# Patient Record
Sex: Male | Born: 1970 | Race: Black or African American | Hispanic: No | Marital: Married | Smoking: Never smoker
Health system: Southern US, Community
[De-identification: ages and names within clinical notes are randomized; demographics above are authoritative.]

## PROBLEM LIST (undated history)

## (undated) DIAGNOSIS — J45909 Unspecified asthma, uncomplicated: Secondary | ICD-10-CM

## (undated) DIAGNOSIS — M1990 Unspecified osteoarthritis, unspecified site: Secondary | ICD-10-CM

## (undated) DIAGNOSIS — T7840XA Allergy, unspecified, initial encounter: Secondary | ICD-10-CM

## (undated) DIAGNOSIS — E785 Hyperlipidemia, unspecified: Secondary | ICD-10-CM

## (undated) DIAGNOSIS — K219 Gastro-esophageal reflux disease without esophagitis: Secondary | ICD-10-CM

## (undated) HISTORY — DX: Gastro-esophageal reflux disease without esophagitis: K21.9

## (undated) HISTORY — DX: Hyperlipidemia, unspecified: E78.5

## (undated) HISTORY — PX: MEDIAL COLLATERAL LIGAMENT AND LATERAL COLLATERAL LIGAMENT REPAIR, KNEE: SHX2017

## (undated) HISTORY — PX: KNEE SURGERY: SHX244

## (undated) HISTORY — DX: Allergy, unspecified, initial encounter: T78.40XA

---

## 2008-08-11 ENCOUNTER — Emergency Department (HOSPITAL_BASED_OUTPATIENT_CLINIC_OR_DEPARTMENT_OTHER): Admission: EM | Admit: 2008-08-11 | Discharge: 2008-08-11 | Payer: Self-pay | Admitting: Emergency Medicine

## 2008-08-15 ENCOUNTER — Ambulatory Visit: Payer: Self-pay | Admitting: *Deleted

## 2008-08-15 DIAGNOSIS — R319 Hematuria, unspecified: Secondary | ICD-10-CM | POA: Insufficient documentation

## 2008-08-15 DIAGNOSIS — K219 Gastro-esophageal reflux disease without esophagitis: Secondary | ICD-10-CM | POA: Insufficient documentation

## 2008-08-15 DIAGNOSIS — E785 Hyperlipidemia, unspecified: Secondary | ICD-10-CM | POA: Insufficient documentation

## 2008-08-15 DIAGNOSIS — J309 Allergic rhinitis, unspecified: Secondary | ICD-10-CM | POA: Insufficient documentation

## 2008-08-15 DIAGNOSIS — J45909 Unspecified asthma, uncomplicated: Secondary | ICD-10-CM | POA: Insufficient documentation

## 2008-08-15 DIAGNOSIS — F17200 Nicotine dependence, unspecified, uncomplicated: Secondary | ICD-10-CM | POA: Insufficient documentation

## 2008-08-15 DIAGNOSIS — F1210 Cannabis abuse, uncomplicated: Secondary | ICD-10-CM | POA: Insufficient documentation

## 2008-08-15 LAB — CONVERTED CEMR LAB
Bilirubin Urine: NEGATIVE
Glucose, Urine, Semiquant: NEGATIVE
Inflenza A Ag: NEGATIVE
Influenza B Ag: NEGATIVE
Ketones, urine, test strip: NEGATIVE
Nitrite: NEGATIVE
Specific Gravity, Urine: 1.025
Urobilinogen, UA: 0.2
WBC Urine, dipstick: NEGATIVE
pH: 5

## 2008-09-03 ENCOUNTER — Ambulatory Visit (HOSPITAL_COMMUNITY): Admission: RE | Admit: 2008-09-03 | Discharge: 2008-09-03 | Payer: Self-pay | Admitting: Urology

## 2010-01-23 ENCOUNTER — Ambulatory Visit: Payer: Self-pay | Admitting: Internal Medicine

## 2010-01-23 DIAGNOSIS — N401 Enlarged prostate with lower urinary tract symptoms: Secondary | ICD-10-CM | POA: Insufficient documentation

## 2010-01-23 LAB — CONVERTED CEMR LAB
ALT: 25 units/L (ref 0–53)
AST: 21 units/L (ref 0–37)
Albumin: 3.9 g/dL (ref 3.5–5.2)
Alkaline Phosphatase: 82 units/L (ref 39–117)
BUN: 8 mg/dL (ref 6–23)
Basophils Absolute: 0 10*3/uL (ref 0.0–0.1)
Basophils Relative: 0.6 % (ref 0.0–3.0)
Bilirubin Urine: NEGATIVE
Bilirubin, Direct: 0.1 mg/dL (ref 0.0–0.3)
CO2: 29 meq/L (ref 19–32)
Calcium: 9.5 mg/dL (ref 8.4–10.5)
Chloride: 106 meq/L (ref 96–112)
Cholesterol: 199 mg/dL (ref 0–200)
Creatinine, Ser: 1.2 mg/dL (ref 0.4–1.5)
Direct LDL: 141.8 mg/dL
Eosinophils Absolute: 0.1 10*3/uL (ref 0.0–0.7)
Eosinophils Relative: 1.1 % (ref 0.0–5.0)
GFR calc non Af Amer: 86.33 mL/min (ref >60)
Glucose, Bld: 88 mg/dL (ref 70–99)
HCT: 47.5 % (ref 39.0–52.0)
HDL: 40.5 mg/dL (ref >39.00)
Hemoglobin: 16 g/dL (ref 13.0–17.0)
Ketones, ur: NEGATIVE mg/dL
Leukocytes, UA: NEGATIVE
Lymphocytes Relative: 38.6 % (ref 12.0–46.0)
Lymphs Abs: 2.8 10*3/uL (ref 0.7–4.0)
MCHC: 33.8 g/dL (ref 30.0–36.0)
MCV: 93.6 fL (ref 78.0–100.0)
Monocytes Absolute: 0.6 10*3/uL (ref 0.1–1.0)
Monocytes Relative: 8.8 % (ref 3.0–12.0)
Neutro Abs: 3.7 10*3/uL (ref 1.4–7.7)
Neutrophils Relative %: 50.9 % (ref 43.0–77.0)
Nitrite: NEGATIVE
PSA: 1.37 ng/mL (ref 0.10–4.00)
Platelets: 228 10*3/uL (ref 150.0–400.0)
Potassium: 4 meq/L (ref 3.5–5.1)
RBC: 5.07 M/uL (ref 4.22–5.81)
RDW: 13.8 % (ref 11.5–14.6)
Sodium: 140 meq/L (ref 135–145)
Specific Gravity, Urine: 1.02 (ref 1.000–1.030)
TSH: 0.85 microintl units/mL (ref 0.35–5.50)
Total Bilirubin: 0.3 mg/dL (ref 0.3–1.2)
Total CHOL/HDL Ratio: 5
Total Protein: 7.2 g/dL (ref 6.0–8.3)
Triglycerides: 202 mg/dL — ABNORMAL HIGH (ref 0.0–149.0)
Urine Glucose: NEGATIVE mg/dL
Urobilinogen, UA: 0.2 (ref 0.0–1.0)
VLDL: 40.4 mg/dL — ABNORMAL HIGH (ref 0.0–40.0)
WBC: 7.3 10*3/uL (ref 4.5–10.5)
pH: 8 (ref 5.0–8.0)

## 2010-01-24 ENCOUNTER — Encounter: Payer: Self-pay | Admitting: Internal Medicine

## 2010-05-09 ENCOUNTER — Encounter: Payer: Self-pay | Admitting: Internal Medicine

## 2010-06-06 ENCOUNTER — Emergency Department (HOSPITAL_BASED_OUTPATIENT_CLINIC_OR_DEPARTMENT_OTHER): Admission: EM | Admit: 2010-06-06 | Discharge: 2010-06-06 | Payer: Self-pay | Admitting: Emergency Medicine

## 2010-06-06 ENCOUNTER — Ambulatory Visit: Payer: Self-pay | Admitting: Interventional Radiology

## 2010-08-04 ENCOUNTER — Encounter
Admission: RE | Admit: 2010-08-04 | Discharge: 2010-10-08 | Payer: Self-pay | Source: Home / Self Care | Attending: Orthopedic Surgery | Admitting: Orthopedic Surgery

## 2010-11-25 NOTE — Letter (Signed)
Summary: LEC Referral (unable to schedule) Notification  [facility]  [address] [place], [place] [address]   Phone: [phone]  Fax: [phone]      May 09, 2010 Matthew Franco 03/06/1971 MRN: [id number]   [facility] [address] [place], [place]  [address]   Dear Dr. Yetta Barre:   Thank you for your kind referral of the above patient. We have attempted to schedule the recommended COLONOSCOPY but have been unable to schedule because:  _X_ The patient was not available by phone and/or has not returned our calls.  __ The patient declined to schedule the procedure at this time.  We appreciate the referral and hope that we will have the opportunity to treat this patient in the future.    Sincerely,   [facility]  Vania Rea. Jarold Motto M.D. Hedwig Morton. Juanda Chance M.D. Venita Lick. Russella Dar M.D. Wilhemina Bonito. Marina Goodell M.D. Barbette Hair. Arlyce Dice M.D. Iva Boop M.D. Cheron Every.D.

## 2010-11-25 NOTE — Letter (Signed)
Summary: Lipid Letter  [facility]  [address] [place], [place] [address]   Phone: [phone]  Fax: [phone]    01/24/2010  Matthew Franco [address] [place], [place]  [address]  Dear Matthew Franco:  We have carefully reviewed your last lipid profile from  and the results are noted below with a summary of recommendations for lipid management.    Cholesterol:       199     Goal: <200   HDL "good" Cholesterol:   44.01     Goal: >40   LDL "bad" Cholesterol:   142     Goal: <130   Triglycerides:       202.0   !   Goal: <150    other labs look good    TLC Diet (Therapeutic Lifestyle Change): Saturated Fats & Transfatty acids should be kept < 7% of total calories ***Reduce Saturated Fats Polyunstaurated Fat can be up to 10% of total calories Monounsaturated Fat Fat can be up to 20% of total calories Total Fat should be no greater than 25-35% of total calories Carbohydrates should be 50-60% of total calories Protein should be approximately 15% of total calories Fiber should be at least 20-30 grams a day ***Increased fiber may help lower LDL Total Cholesterol should be < 200mg /day Consider adding plant stanol/sterols to diet (example: Benacol spread) ***A higher intake of unsaturated fat may reduce Triglycerides and Increase HDL    Adjunctive Measures (may lower LIPIDS and reduce risk of Heart Attack) include: Aerobic Exercise (20-30 minutes 3-4 times a week) Limit Alcohol Consumption Weight Reduction Aspirin 75-81 mg a day by mouth (if not allergic or contraindicated) Dietary Fiber 20-30 grams a day by mouth     Current Medications: 1)    Ventolin Hfa 108 (90 Base) Mcg/act Aers (Albuterol sulfate) .Marland Kitchen.. 1-2 puffs every 3-4 hours as needed shortness of breath.  (can substitute any brand of albuterol that is cheaper for patient) 2)    Dexilant 60 Mg Cpdr (Dexlansoprazole) .... One by mouth once daily for heartburn  If you have any questions, please call.  We appreciate being able to work with you.   Sincerely,    [facility] Etta Grandchild MD

## 2010-11-25 NOTE — Assessment & Plan Note (Signed)
Summary: NEW/ [employer] / NWS #   Vital Signs:  Patient profile:   40 year old male Height:      70 inches Weight:      223 pounds BMI:     32.11 O2 Sat:      97 % on Room air Temp:     98.0 degrees F oral Pulse rate:   69 / minute Pulse rhythm:   regular Resp:     16 per minute BP sitting:   120 / 68  (left arm) Cuff size:   large  Vitals Entered By: Rock Nephew CMA (January 23, 2010 1:24 PM)  Nutrition Counseling: Patient's BMI is greater than 25 and therefore counseled on weight management options.  O2 Flow:  Room air  Primary Care Provider:  Etta Grandchild MD   History of Present Illness: New to me to establish primary care.  Asthma History    Initial Asthma Severity Rating:    Age range: 12+ years    Symptoms: 0-2 days/week    Nighttime Awakenings: 0-2/month    Interferes w/ normal activity: no limitations    SABA use (not for EIB): 0-2 days/week    Asthma Severity Assessment: Intermittent  Dyspepsia History:      He has no alarm features of dyspepsia including no history of melena, hematochezia, dysphagia, persistent vomiting, or involuntary weight loss > 5%.  There is a prior history of GERD.  He notes that he has never had an upper endoscopy or GI consultation.  The patient does not have a prior history of documented ulcer disease.  The dominant symptom is heartburn or acid reflux.  An H-2 blocker medication is not currently being taken.      Preventive Screening-Counseling & Management  Alcohol-Tobacco     Smoking Status: current     Smoking Cessation Counseling: yes  Caffeine-Diet-Exercise     Does Patient Exercise: no      Drug Use:  yes.    Current Medications (verified): 1)  Ventolin Hfa 108 (90 Base) Mcg/act Aers (Albuterol Sulfate) .Marland Kitchen.. 1-2 Puffs Every 3-4 Hours As Needed Shortness of Breath.  (Can Substitute Any Brand of Albuterol That Is Cheaper For Patient)  Allergies (verified): No Known Drug Allergies  Past History:  Past Medical  History: Reviewed history from 08/15/2008 and no changes required. Asthma GERD Hyperlipidemia costcochondritis Allergic rhinitis  Past Surgical History: Reviewed history from 08/15/2008 and no changes required. knee surgery  Family History: Reviewed history from 08/15/2008 and no changes required. Family History of Alcoholism/Addiction Family History Breast cancer 1st degree relative <50 Family History of Colon CA 1st degree relative <60 Family History of Prostate CA 1st degree relative <50  Social History: Reviewed history from 08/15/2008 and no changes required. 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  The patient denies anorexia, fever, weight loss, weight gain, chest pain, syncope, dyspnea on exertion, peripheral edema, prolonged cough, headaches, hemoptysis, abdominal pain, melena, hematochezia, hematuria, suspicious skin lesions, difficulty walking, depression, abnormal bleeding, enlarged lymph nodes, angioedema, and testicular masses.   GU:  Complains of urinary frequency; denies decreased libido, discharge, dysuria, erectile dysfunction, genital sores, hematuria, incontinence, nocturia, and urinary hesitancy.  Physical Exam  General:  alert, well-developed, well-nourished, well-hydrated, appropriate dress, normal appearance, healthy-appearing, cooperative to examination, good hygiene, and overweight-appearing.   Head:  normocephalic, atraumatic,  no abnormalities observed, and no abnormalities palpated.   Eyes:  vision grossly intact, pupils equal, pupils round, pupils reactive to light, and pupils react to accomodation.   Ears:  R ear normal and L ear normal.   Mouth:  Oral mucosa and oropharynx without lesions or exudates.  Teeth in good repair. Neck:  supple, full ROM, no masses, no  thyromegaly, no thyroid nodules or tenderness, no JVD, normal carotid upstroke, no carotid bruits, and no cervical lymphadenopathy.   Lungs:  normal respiratory effort, no intercostal retractions, no accessory muscle use, normal breath sounds, no dullness, no fremitus, no crackles, and no wheezes.   Heart:  normal rate, regular rhythm, no murmur, no gallop, no rub, and no JVD.   Abdomen:  soft, non-tender, normal bowel sounds, no distention, no masses, no guarding, no rigidity, no rebound tenderness, no abdominal hernia, no inguinal hernia, no hepatomegaly, and no splenomegaly.   Rectal:  No external abnormalities noted. Normal sphincter tone. No rectal masses or tenderness. heme neg. stool. Genitalia:  circumcised, no hydrocele, no varicocele, no scrotal masses, no testicular masses or atrophy, no cutaneous lesions, and no urethral discharge.   Prostate:  no nodules, no asymmetry, no induration, and 1+ enlarged.   Msk:  No deformity or scoliosis noted of thoracic or lumbar spine.   Pulses:  R and L carotid,radial,femoral,dorsalis pedis and posterior tibial pulses are full and equal bilaterally Extremities:  No clubbing, cyanosis, edema, or deformity noted with normal full range of motion of all joints.   Neurologic:  No cranial nerve deficits noted. Station and gait are normal. Plantar reflexes are down-going bilaterally. DTRs are symmetrical throughout. Sensory, motor and coordinative functions appear intact. Skin:  Intact without suspicious lesions or rashes Cervical Nodes:  no anterior cervical adenopathy and no posterior cervical adenopathy.   Axillary Nodes:  no R axillary adenopathy and no L axillary adenopathy.   Inguinal Nodes:  no R inguinal adenopathy and no L inguinal adenopathy.   Psych:  Cognition and judgment appear intact. Alert and cooperative with normal attention span and concentration. No apparent delusions, illusions, hallucinations   Impression & Recommendations:  Problem #  1:  HYPERTROPHY PROSTATE W/UR OBST & OTH LUTS (ICD-600.01) Assessment New  Orders: Venipuncture ([address]) TLB-Lipid Panel (80061-LIPID) TLB-BMP (Basic Metabolic Panel-BMET) (80048-METABOL) TLB-CBC Platelet - w/Differential (85025-CBCD) TLB-Hepatic/Liver Function Pnl (80076-HEPATIC) TLB-TSH (Thyroid Stimulating Hormone) (84443-TSH) TLB-PSA (Prostate Specific Antigen) (84153-PSA) TLB-Udip w/ Micro (81001-URINE)  Problem # 2:  HYPERLIPIDEMIA (ICD-272.4) Assessment: Unchanged  Orders: Venipuncture ([address]) TLB-Lipid Panel (80061-LIPID) TLB-BMP (Basic Metabolic Panel-BMET) (80048-METABOL) TLB-CBC Platelet - w/Differential (85025-CBCD) TLB-Hepatic/Liver Function Pnl (80076-HEPATIC) TLB-TSH (Thyroid Stimulating Hormone) (84443-TSH) TLB-PSA (Prostate Specific Antigen) (84153-PSA) TLB-Udip w/ Micro (81001-URINE)  Problem # 3:  GERD (ICD-530.81) Assessment: Deteriorated  His updated medication list for this problem includes:    Dexilant 60 Mg Cpdr (Dexlansoprazole) ..... One by mouth once daily for heartburn  Orders: Venipuncture ([address]) TLB-Lipid Panel (80061-LIPID) TLB-BMP (Basic Metabolic Panel-BMET) (80048-METABOL) TLB-CBC Platelet - w/Differential (85025-CBCD) TLB-Hepatic/Liver Function Pnl (80076-HEPATIC) TLB-TSH (Thyroid Stimulating Hormone) (84443-TSH) TLB-PSA (Prostate Specific Antigen) (84153-PSA) TLB-Udip w/ Micro (81001-URINE) Hemoccult Guaiac-1 spec.(in office) (91478) Gastroenterology Referral (GI)  Complete Medication List: 1)  Ventolin Hfa 108 (90 Base) Mcg/act Aers (Albuterol sulfate) .Marland Kitchen.. 1-2 puffs every 3-4 hours as needed shortness of breath.  (can substitute any brand of albuterol that is cheaper for patient) 2)  Dexilant 60 Mg Cpdr (Dexlansoprazole) .... One by mouth once daily for heartburn  Patient Instructions: 1)  Avoid foods high in  acid (tomatoes, citrus juices, spicy foods). Avoid eating within two hours of lying down or before exercising. Do not over  eat; try smaller more frequent meals. Elevate head of bed twelve inches when sleeping. 2)  Tobacco is very bad for your health and your loved ones! You Should stop smoking!. 3)  Stop Smoking Tips: Choose a Quit date. Cut down before the Quit date. decide what you will do as a substitute when you feel the urge to smoke(gum,toothpick,exercise). 4)  It is important that you exercise regularly at least 20 minutes 5 times a week. If you develop chest pain, have severe difficulty breathing, or feel very tired , stop exercising immediately and seek medical attention. 5)  You need to lose weight. Consider a lower calorie diet and regular exercise.  6)  Schedule a colonoscopy/sigmoidoscopy to help detect colon cancer. Prescriptions: DEXILANT 60 MG CPDR (DEXLANSOPRAZOLE) One by mouth once daily for heartburn  #75 x 0   Entered and Authorized by:   Etta Grandchild MD   Signed by:   Etta Grandchild MD on 01/23/2010   Method used:   Samples Given   RxID:   [id number]   Preventive Care Screening  Last Tetanus Booster:    Date:  10/26/2004    Results:  Historical     Not Administered:    Influenza Vaccine not given due to: declined

## 2010-11-25 NOTE — Assessment & Plan Note (Signed)
Summary: new to be est., f/u after in E.R.   Vital Signs:  Patient Profile:   40 Years Old Male Height:     71.5 inches Weight:      213 pounds BMI:     29.40 O2 treatment:    Room Air Temp:     98.1 degrees F oral Pulse rate:   72 / minute Pulse rhythm:   regular Resp:     20 per minute BP sitting:   120 / 78  (right arm) Cuff size:   regular  Vitals Entered By: Darra Lis RMA (August 15, 2008 2:08 PM)             Is Patient Diabetic? No     Visit Type:  new patient - acute PCP:  Paulo Fruit MD  Chief Complaint:  Establish care with new doctor.Marland Kitchen  History of Present Illness: Patient c/o URI type symptoms with scratchy throat, PND, rhinorrhea  x 6 days.  Today he is actually feeling a little better.  He was seen at the ER on Saturday for chest pains. He states that for about a month he's been having fleeting chest pains in the left chest area, but the pains increased during his recent URI to the point where he felt he needed to be seen.  The pain was described as fleeting and sharp - lasting only a few minutes, but returning several times during the day.  He denied any radiation, no SOB, no nausea or sweating associated.  The URI symptoms worsened the pain, but nothing seemed to improve them.  He denied any association with food or certain activity.  He had not tried any treatments or seen anyone for the pain prior to the ER visit.  ER records are reviewed and show a normal CXR, CBC / BMP WNL, negative cardiac enzymes and D-dimer,  EKG  WNL.  The ER note points towards costochondritis / pleuritic pain.  The patient was discharged with ibuporfen 800mg  three times a day and an appt to establish care with our office.  He states that since starting the ibuprofen, he has noted improvement in the chest pain.  On further questioning, it turns out that He does a lot of heavy lifting in his work and had an increase in the amount of lifting just prior to his initial c/o chest  pains.  When I point out the correlation, he states, "Now that really makes sense."  He further explains that now that he thinks about it, he does have more trouble with the pains during heavier work days.  The URI symptoms have resolved for the most part except for some PND, rhinorrhea and sneezing.  He did have some loose stools in the beginning, but no report of nausea, vomiting.  On further discussion, the patient notes that he does get PND and rhinorrhea frequently even without true cold symptoms.  Also, he has a h/o asthma, but has been out of his inhaler for a long time.  He uses it rarely, but has felt some wheezing off/on during the past month.  Also, he was told a year ago during a medical visit that he had hematuria, but has never been able to follow up.  He does not see blood in the urine, but wants to be sure it has cleared.    Updated Prior Medication List: * ALBUTEROL INHALER use as directed (out of meds) IBUPROFEN 800 MG TABS (IBUPROFEN) 1 tab three times a day as needed  Current  Allergies (reviewed today): No known allergies   Past Medical History:    Asthma    GERD    Hyperlipidemia    costcochondritis    Allergic rhinitis  Past Surgical History:    knee surgery   Family History:    Family History of Alcoholism/Addiction    Family History Breast cancer 1st degree relative <50    Family History of Colon CA 1st degree relative <60    Family History of Prostate CA 1st degree relative <50  Social History:    Occupation: self employed delivering [employer]   Risk Factors:  Tobacco use:  current    Year started:  1996    Cigarettes:  Yes -- 1PPD - but cutting back pack(s) per day    Counseled to quit/cut down tobacco use:  yes Passive smoke exposure:  yes Drug use:  yes    Substance:  marijuana    Comments:  reviewed health risks and illegal nature of using marijuana HIV high-risk behavior:  no Caffeine use:  5+ - told to cut back drinks per day Alcohol use:   yes    Type:  beer    Drinks per day:  <1    Has patient --       Felt need to cut down:  no       Been annoyed by complaints:  no       Felt guilty about drinking:  no       Needed eye opener in the morning:  no    Counseled to quit/cut down alcohol use:  no Exercise:  yes    Times per week:  5    Type:  walking Seatbelt use:  100 % Sun Exposure:  frequently  Family History Risk Factors:    Family History of MI in females < 38 years old:  no    Family History of MI in males < 50 years old:  no   Review of Systems  The patient denies anorexia, fever, weight loss, weight gain, vision loss, decreased hearing, syncope, peripheral edema, headaches, hemoptysis, abdominal pain, melena, hematochezia, genital sores, muscle weakness, suspicious skin lesions, transient blindness, difficulty walking, depression, unusual weight change, enlarged lymph nodes, angioedema, and testicular masses.     Physical Exam  General:     Well-developed, well nourished, well hydrated, in no acute distress; appropriate and cooperative   Head:     Normocephalic and atraumatic without obvious abnormalities.  Eyes:     No corneal or conjunctival inflammation. EOMI. PERRLA. Funduscopic exam benign, Vision grossly normal. Ears:     External ear shows no significant lesions or deformities.  Otoscopic examination reveals clear canals, tympanic membranes normal bilaterally. Hearing is grossly normal. Nose:     External nasal examination shows no deformity or inflammation. Nasal mucosa are pale and boggey without lesions or exudates, some rhinorrhea - clear. Mouth:     Oral mucosa and oropharynx without lesions, erythema or exudates.  Plus PND   Neck:     supple, full ROM, no masses, and no thyromegaly.   Chest Wall:     No deformities, masses, but tenderness noted to palpation in area of pain. Lungs:     Normal respiratory effort, chest expands symmetrically with good air flow noted. Lungs with no crackles,  rales or wheezes.   Heart:     Normal rate and  rhythm. S1 and S2 normal, without gallop, murmur, click, rub  Abdomen:     Bowel sounds positive,  abdomen soft and non-tender without masses, organomegaly or hernias noted. no distention  Msk:     No gross deformity noted of cervical, thoracic or lumbar spine. normal ROM. no muscle atrophy noted.   Extremities:     No clubbing, cyanosis, edema, or deformity noted, grossly normal full range of motion with all joints.   Neurologic:     alert & oriented X3,  gait normal.  no deficit in strength noted, no balance problems noted, neuro is grossly intact Skin:     Intact without suspicious lesions or rashes Psych:     Cognition and judgment appear intact. Alert and cooperative with normal attention span and concentration. No apparent delusions, illusions, hallucinations, normally interactive, good eye contact, not anxious or depressed appearing, and not agitated.        Impression & Recommendations:  Problem # 1:  COSTOCHONDRITIS (ICD-733.6) The pain is already improving on the ibuprofen 800mg  three times a day as needed given by the ER.  He has enough for 10 days.  I have told the patient that as the pain improves, he can start splitting the pills in half and then when they run out, he can use OTC ibuprofen 1-2 tabs by mouth three times a day prin.  He is also to use heat as needed.  He is to avoid excessive lifting / pushing / pulling as much as possible.  Also reviewed the need to seek immediate medical attention if any of the "red flag" symptoms are present - reviewed in detail with patient.  He is to f/u in one month.  call for any concerns.  Problem # 2:  ASTHMA (ICD-493.90) Part of his chest pain may be due to the lack of his rescue meds as he mentions that he has had some wheezing over the past month.  Will refill the meds.  Also demonstrated proper use of the MDI.  "red flag" symptoms reviewed that would require immediate medical  attention. His updated medication list for this problem includes:    Ventolin Hfa 108 (90 Base) Mcg/act Aers (Albuterol sulfate) .Marland Kitchen... 1-2 puffs every 3-4 hours as needed shortness of breath.  (can substitute any brand of albuterol that is cheaper for patient)   Problem # 3:  ALLERGIC RHINITIS (ICD-477.9) The patient gives a h/o that sounds like he has an allergic component.  I've asked him to try OTC loratadine for these symptoms. f/u in one month  Problem # 4:  HEMATURIA UNSPECIFIED (ICD-599.70) UA today shows moderate blood - persistent for over a year in a smoker.   Will refer to urology for further work up.  Also, UA shows mild dehydration.  I have talked to the patient about reducing his caffeine intake and increasing his water intake. Orders: UA Dipstick w/o Micro (manual) (62130) Urology Referral (Urology)   Problem # 5:  TOBACCO ABUSE (ICD-305.1) patient has cut back to only a 7 cigarettes in the past few days since his ER visit.  He feels he can quite cold [place] and appears motivated.  encouragement given.  Problem # 6:  MARIJUANA ABUSE (ICD-305.20) Long d/w patient about the illegal nature of marijuanna and the health risks.  He is not motivated at this time to stop this habit.   Problem # 7:  URI (ICD-465.9) Resolving, but patient requested the flu test.  It was negative. His updated medication list for this problem includes:    Ibuprofen 800 Mg Tabs (Ibuprofen) .Marland Kitchen... 1 tab three times a day as needed  Complete Medication List: 1)  Ventolin Hfa 108 (90 Base) Mcg/act Aers (Albuterol sulfate) .Marland Kitchen.. 1-2 puffs every 3-4 hours as needed shortness of breath.  (can substitute any brand of albuterol that is cheaper for patient) 2)  Ibuprofen 800 Mg Tabs (Ibuprofen) .Marland Kitchen.. 1 tab three times a day as needed  Other Orders: Flu A+B (40981)   Patient Instructions: 1)  Please schedule a follow-up appointment in 1 month.   Prescriptions: VENTOLIN HFA 108 (90 BASE) MCG/ACT AERS  (ALBUTEROL SULFATE) 1-2 puffs every 3-4 hours as needed shortness of breath.  (can substitute any brand of albuterol that is cheaper for patient)  #1 x 4   Entered and Authorized by:   Paulo Fruit MD   Signed by:   Paulo Fruit MD on 08/15/2008   Method used:   Electronically to        [facility] [phone]* (retail)       [address]       [place], [place]  [address]       Ph: [phone]       Fax: [phone]   RxID:   [id number]  ] Laboratory Results   Urine Tests  Date/Time Received: August 15, 2008 3:14 PM  Date/Time Reported: August 15, 2008 3:14 PM   Routine Urinalysis   Color: yellow Appearance: Clear Glucose: negative   (Normal Range: Negative) Bilirubin: negative   (Normal Range: Negative) Ketone: negative   (Normal Range: Negative) Spec. Gravity: 1.025   (Normal Range: 1.003-1.035) Blood: moderate   (Normal Range: Negative) pH: 5.0   (Normal Range: 5.0-8.0) Protein: trace   (Normal Range: Negative) Urobilinogen: 0.2   (Normal Range: 0-1) Nitrite: negative   (Normal Range: Negative) Leukocyte Esterace: negative   (Normal Range: Negative)    Comments: Darra Lis RMA  August 15, 2008 3:15 PM  Date/Time Received: August 15, 2008 3:15 PM  Date/Time Reported: August 15, 2008 3:15 PM   Other Tests  Influenza A: negative Influenza B: negative Comments: Darra Lis RMA  August 15, 2008 3:15 PM    Appended Document: new to be est., f/u after in E.R. coding note: the 25 modifier was added in error.

## 2011-01-12 ENCOUNTER — Encounter: Payer: Self-pay | Admitting: Internal Medicine

## 2011-01-12 ENCOUNTER — Other Ambulatory Visit: Payer: Self-pay | Admitting: Internal Medicine

## 2011-01-12 ENCOUNTER — Ambulatory Visit (INDEPENDENT_AMBULATORY_CARE_PROVIDER_SITE_OTHER): Payer: BC Managed Care – PPO | Admitting: Internal Medicine

## 2011-01-12 ENCOUNTER — Other Ambulatory Visit: Payer: BC Managed Care – PPO

## 2011-01-12 DIAGNOSIS — N401 Enlarged prostate with lower urinary tract symptoms: Secondary | ICD-10-CM

## 2011-01-12 DIAGNOSIS — E785 Hyperlipidemia, unspecified: Secondary | ICD-10-CM

## 2011-01-12 DIAGNOSIS — K648 Other hemorrhoids: Secondary | ICD-10-CM | POA: Insufficient documentation

## 2011-01-12 DIAGNOSIS — F528 Other sexual dysfunction not due to a substance or known physiological condition: Secondary | ICD-10-CM

## 2011-01-12 DIAGNOSIS — N138 Other obstructive and reflux uropathy: Secondary | ICD-10-CM

## 2011-01-12 DIAGNOSIS — R319 Hematuria, unspecified: Secondary | ICD-10-CM

## 2011-01-12 DIAGNOSIS — Z800 Family history of malignant neoplasm of digestive organs: Secondary | ICD-10-CM

## 2011-01-12 LAB — BASIC METABOLIC PANEL
BUN: 8 mg/dL (ref 6–23)
CO2: 28 mEq/L (ref 19–32)
Calcium: 9.1 mg/dL (ref 8.4–10.5)
Chloride: 101 mEq/L (ref 96–112)
Creatinine, Ser: 1.2 mg/dL (ref 0.4–1.5)
GFR: 82.72 mL/min (ref >60.00)
Glucose, Bld: 100 mg/dL — ABNORMAL HIGH (ref 70–99)
Potassium: 4.7 mEq/L (ref 3.5–5.1)
Sodium: 135 mEq/L (ref 135–145)

## 2011-01-12 LAB — HEPATIC FUNCTION PANEL
ALT: 20 U/L (ref 0–53)
AST: 29 U/L (ref 0–37)
Albumin: 4.1 g/dL (ref 3.5–5.2)
Alkaline Phosphatase: 78 U/L (ref 39–117)
Bilirubin, Direct: 0.1 mg/dL (ref 0.0–0.3)
Total Bilirubin: 0.6 mg/dL (ref 0.3–1.2)
Total Protein: 7.4 g/dL (ref 6.0–8.3)

## 2011-01-12 LAB — URINALYSIS, ROUTINE W REFLEX MICROSCOPIC
Bilirubin Urine: NEGATIVE
Ketones, ur: NEGATIVE
Leukocytes, UA: NEGATIVE
Nitrite: NEGATIVE
Specific Gravity, Urine: 1.02
Total Protein, Urine: 30
Urine Glucose: NEGATIVE
Urobilinogen, UA: 0.2
pH: 6.5 (ref 5.0–8.0)

## 2011-01-12 LAB — CBC WITH DIFFERENTIAL/PLATELET
Basophils Absolute: 0 10*3/uL (ref 0.0–0.1)
Basophils Relative: 0.7 % (ref 0.0–3.0)
Eosinophils Absolute: 0.1 10*3/uL (ref 0.0–0.7)
Eosinophils Relative: 1.6 % (ref 0.0–5.0)
HCT: 45.9 % (ref 39.0–52.0)
Hemoglobin: 15.9 g/dL (ref 13.0–17.0)
Lymphocytes Relative: 44.4 % (ref 12.0–46.0)
Lymphs Abs: 2.7 10*3/uL (ref 0.7–4.0)
MCHC: 34.5 g/dL (ref 30.0–36.0)
MCV: 92.6 fl (ref 78.0–100.0)
Monocytes Absolute: 0.6 10*3/uL (ref 0.1–1.0)
Monocytes Relative: 9 % (ref 3.0–12.0)
Neutro Abs: 2.7 10*3/uL (ref 1.4–7.7)
Neutrophils Relative %: 44.3 % (ref 43.0–77.0)
Platelets: 217 10*3/uL (ref 150.0–400.0)
RBC: 4.96 Mil/uL (ref 4.22–5.81)
RDW: 13.8 % (ref 11.5–14.6)
WBC: 6.2 10*3/uL (ref 4.5–10.5)

## 2011-01-12 LAB — TSH: TSH: 1.12 u[IU]/mL (ref 0.35–5.50)

## 2011-01-12 LAB — CONVERTED CEMR LAB

## 2011-01-12 LAB — PSA: PSA: 1.31 ng/mL (ref 0.10–4.00)

## 2011-01-22 NOTE — Assessment & Plan Note (Signed)
Summary: hemorroids? last ov 3/11   Vital Signs:  Patient profile:   40 year old male Height:      70 inches Weight:      235 pounds BMI:     33.84 O2 Sat:      97 % Temp:     98.6 degrees F oral Pulse rate:   70 / minute Pulse rhythm:   regular Resp:     16 per minute BP sitting:   122 / 84  (left arm) Cuff size:   large  Vitals Entered By: Rock Nephew CMA (January 12, 2011 11:16 AM)  Nutrition Counseling: Patient's BMI is greater than 25 and therefore counseled on weight management options.  Primary Care Provider:  Etta Grandchild MD   History of Present Illness: He returns complaining of a 2 month history of anal burning and itching. He has gotten some relief with Prep H.  Preventive Screening-Counseling & Management  Alcohol-Tobacco     Alcohol drinks/day: <1     Alcohol type: beer     >5/day in last 3 mos: no     Alcohol Counseling: not indicated; use of alcohol is not excessive or problematic     Feels need to cut down: no     Feels annoyed by complaints: no     Feels guilty re: drinking: no     Needs 'eye opener' in am: no     Smoking Status: current     Smoking Cessation Counseling: yes     Packs/Day: 1PPD - but cutting back     Year Started: 1996     Passive Smoke Exposure: yes     Tobacco Counseling: to quit use of tobacco products  Hep-HIV-STD-Contraception     Hepatitis Risk: no risk noted     HIV Risk: no     STD Risk: no risk noted     Sun Exposure-Excessive: frequently      Sexual History:  currently monogamous.        Drug Use:  never.        Blood Transfusions:  no.    Clinical Review Panels:  Prevention   Last PSA:  1.37 (01/23/2010)  Immunizations   Last Tetanus Booster:  Historical (10/26/2004)  Lipid Management   Cholesterol:  199 (01/23/2010)   HDL (good cholesterol):  40.50 (01/23/2010)  Diabetes Management   Creatinine:  1.2 (01/23/2010)  CBC   WBC:  7.3 (01/23/2010)   RBC:  5.07 (01/23/2010)   Hgb:  16.0  (01/23/2010)   Hct:  47.5 (01/23/2010)   Platelets:  228.0 (01/23/2010)   MCV  93.6 (01/23/2010)   MCHC  33.8 (01/23/2010)   RDW  13.8 (01/23/2010)   PMN:  50.9 (01/23/2010)   Lymphs:  38.6 (01/23/2010)   Monos:  8.8 (01/23/2010)   Eosinophils:  1.1 (01/23/2010)   Basophil:  0.6 (01/23/2010)  Complete Metabolic Panel   Glucose:  88 (01/23/2010)   Sodium:  140 (01/23/2010)   Potassium:  4.0 (01/23/2010)   Chloride:  106 (01/23/2010)   CO2:  29 (01/23/2010)   BUN:  8 (01/23/2010)   Creatinine:  1.2 (01/23/2010)   Albumin:  3.9 (01/23/2010)   Total Protein:  7.2 (01/23/2010)   Calcium:  9.5 (01/23/2010)   Total Bili:  0.3 (01/23/2010)   Alk Phos:  82 (01/23/2010)   SGPT (ALT):  25 (01/23/2010)   SGOT (AST):  21 (01/23/2010)   Medications Prior to Update: 1)  Ventolin Hfa 108 (90 Base) Mcg/act Aers (  Albuterol Sulfate) .Marland Kitchen.. 1-2 Puffs Every 3-4 Hours As Needed Shortness of Breath.  (Can Substitute Any Brand of Albuterol That Is Cheaper For Patient) 2)  Dexilant 60 Mg Cpdr (Dexlansoprazole) .... One By Mouth Once Daily For Heartburn  Current Medications (verified): 1)  Ventolin Hfa 108 (90 Base) Mcg/act Aers (Albuterol Sulfate) .Marland Kitchen.. 1-2 Puffs Every 3-4 Hours As Needed Shortness of Breath.  (Can Substitute Any Brand of Albuterol That Is Cheaper For Patient) 2)  Dexilant 60 Mg Cpdr (Dexlansoprazole) .... One By Mouth Once Daily For Heartburn 3)  Cialis 20 Mg Tabs (Tadalafil) .... Take One By Mouth As Directed 4)  Proctofoam Hc 1-1 % Foam (Hydrocortisone Ace-Pramoxine) .... Insert As Directed Two Times A Day As Needed For Hemorrhoid Pain  Allergies (verified): No Known Drug Allergies  Past History:  Past Medical History: Last updated: 08/15/2008 Asthma GERD Hyperlipidemia costcochondritis Allergic rhinitis  Past Surgical History: Last updated: 08/15/2008 knee surgery  Family History: Last updated: 08/15/2008 Family History of Alcoholism/Addiction Family History Breast  cancer 1st degree relative <50 Family History of Colon CA 1st degree relative <60 Family History of Prostate CA 1st degree relative <50  Social History: Last updated: 01/23/2010 Occupation: self employed delivering [place] Married Current Smoker Alcohol use-no Drug use-yes Regular exercise-no  Risk Factors: Alcohol Use: <1 (01/12/2011) >5 drinks/d w/in last 3 months: no (01/12/2011) Caffeine Use: 5+ - told to cut back (08/15/2008) Exercise: no (01/23/2010)  Risk Factors: Smoking Status: current (01/12/2011) Packs/Day: 1PPD - but cutting back (01/12/2011) Passive Smoke Exposure: yes (01/12/2011)  Family History: Reviewed history from 08/15/2008 and no changes required. Family History of Alcoholism/Addiction Family History Breast cancer 1st degree relative <50 Family History of Colon CA 1st degree relative <60 Family History of Prostate CA 1st degree relative <50  Social History: Reviewed history from 01/23/2010 and no changes required. 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  The patient denies anorexia, fever, weight loss, chest pain, syncope, dyspnea on exertion, peripheral edema, prolonged cough, headaches, hemoptysis, abdominal pain, melena, hematochezia, severe indigestion/heartburn, hematuria, incontinence, genital sores, suspicious skin lesions, transient blindness, difficulty walking, enlarged lymph nodes, angioedema, and testicular masses.   GI:  Denies abdominal pain, bloody stools, change in bowel habits, diarrhea, hemorrhoids, indigestion, loss of appetite, nausea, vomiting, vomiting blood, and yellowish skin color. GU:  Complains of erectile dysfunction and urinary frequency; denies decreased  libido, discharge, dysuria, genital sores, hematuria, incontinence, nocturia, and urinary hesitancy.  Physical Exam  General:  alert, well-developed, well-nourished, well-hydrated, appropriate dress, normal appearance, healthy-appearing, cooperative to examination, good hygiene, and overweight-appearing.   Head:  normocephalic, atraumatic, no abnormalities observed, and no abnormalities palpated.   Eyes:  vision grossly intact, pupils equal, pupils round, and pupils reactive to light.   Ears:  External ear exam shows no significant lesions or deformities.  Otoscopic examination reveals clear canals, tympanic membranes are intact bilaterally without bulging, retraction, inflammation or discharge. Hearing is grossly normal bilaterally. Mouth:  Oral mucosa and oropharynx without lesions or exudates.  Teeth in good repair. Neck:  supple, full ROM, no masses, no thyromegaly, no thyroid nodules or tenderness, no JVD, normal carotid upstroke, no carotid bruits, and no cervical lymphadenopathy.   Lungs:  normal respiratory effort, no  intercostal retractions, no accessory muscle use, normal breath sounds, no dullness, no fremitus, no crackles, and no wheezes.   Heart:  normal rate, regular rhythm, no murmur, no gallop, no rub, and no JVD.   Abdomen:  soft, non-tender, normal bowel sounds, no distention, no masses, no guarding, no rigidity, no rebound tenderness, no abdominal hernia, no inguinal hernia, no hepatomegaly, and no splenomegaly.   Rectal:  no external abnormalities, normal sphincter tone, no masses, no tenderness, no fissures, no fistulae, no perianal rash, external hemorrhoid(s), and internal hemorrhoid(s).   Genitalia:  circumcised, no hydrocele, no varicocele, no scrotal masses, no testicular masses or atrophy, no cutaneous lesions, and no urethral discharge.   Prostate:  Prostate gland firm and smooth, no enlargement, nodularity, tenderness, mass, asymmetry or induration. Msk:  No deformity or  scoliosis noted of thoracic or lumbar spine.   Pulses:  R and L carotid,radial,femoral,dorsalis pedis and posterior tibial pulses are full and equal bilaterally Extremities:  No clubbing, cyanosis, edema, or deformity noted with normal full range of motion of all joints.   Neurologic:  No cranial nerve deficits noted. Station and gait are normal. Plantar reflexes are down-going bilaterally. DTRs are symmetrical throughout. Sensory, motor and coordinative functions appear intact. Skin:  Intact without suspicious lesions or rashes Cervical Nodes:  no anterior cervical adenopathy and no posterior cervical adenopathy.   Axillary Nodes:  no R axillary adenopathy and no L axillary adenopathy.   Inguinal Nodes:  no R inguinal adenopathy and no L inguinal adenopathy.   Psych:  Cognition and judgment appear intact. Alert and cooperative with normal attention span and concentration. No apparent delusions, illusions, hallucinations   Impression & Recommendations:  Problem # 1:  ERECTILE DYSFUNCTION (ICD-302.72) Assessment New  His updated medication list for this problem includes:    Cialis 20 Mg Tabs (Tadalafil) .Marland Kitchen... Take one by mouth as directed  Orders: TLB-BMP (Basic Metabolic Panel-BMET) (80048-METABOL) TLB-CBC Platelet - w/Differential (85025-CBCD) TLB-Hepatic/Liver Function Pnl (80076-HEPATIC) TLB-TSH (Thyroid Stimulating Hormone) (84443-TSH) TLB-Udip w/ Micro (81001-URINE) TLB-PSA (Prostate Specific Antigen) (84153-PSA)  Discussed proper use of medications, as well as side effects.   Problem # 2:  INTERNAL HEMORRHOIDS WITH OTHER COMPLICATION (ICD-455.2) Assessment: New try proctofoam hc as needed , will see if Dr. Jennye Boroughs new hemorrhoid program can help him Orders: Gastroenterology Referral (GI)  Problem # 3:  TOBACCO ABUSE (ICD-305.1) Assessment: Unchanged  Encouraged smoking cessation and discussed different methods for smoking cessation.   Problem # 4:  HEMATURIA UNSPECIFIED  (ICD-599.70) Assessment: Unchanged  Orders: TLB-BMP (Basic Metabolic Panel-BMET) (80048-METABOL) TLB-CBC Platelet - w/Differential (85025-CBCD) TLB-Hepatic/Liver Function Pnl (80076-HEPATIC) TLB-TSH (Thyroid Stimulating Hormone) (84443-TSH) TLB-Udip w/ Micro (81001-URINE) TLB-PSA (Prostate Specific Antigen) (84153-PSA)  Complete Medication List: 1)  Ventolin Hfa 108 (90 Base) Mcg/act Aers (Albuterol sulfate) .Marland Kitchen.. 1-2 puffs every 3-4 hours as needed shortness of breath.  (can substitute any brand of albuterol that is cheaper for patient) 2)  Dexilant 60 Mg Cpdr (Dexlansoprazole) .... One by mouth once daily for heartburn 3)  Cialis 20 Mg Tabs (Tadalafil) .... Take one by mouth as directed 4)  Proctofoam Hc 1-1 % Foam (Hydrocortisone ace-pramoxine) .... Insert as directed two times a day as needed for hemorrhoid pain  Other Orders: Hemoccult Guaiac-1 spec.(in office) (04540)  Colorectal Screening:  Current Recommendations:    Hemoccult: NEG X 1 today    Colonoscopy recommended: scheduled with G.I.  PSA Screening:    PSA: 1.37  (01/23/2010)    Reviewed PSA screening recommendations: PSA ordered  Immunization & Chemoprophylaxis:    Tetanus vaccine: Historical  (10/26/2004)  Patient Instructions: 1)  Please schedule a follow-up appointment in 2 months. 2)  It is important that you exercise regularly at least 20 minutes 5 times a week. If you develop chest pain, have severe difficulty breathing, or feel very tired , stop exercising immediately and seek medical attention. 3)  You need to lose weight. Consider a lower calorie diet and regular exercise.  4)  Schedule a colonoscopy/sigmoidoscopy to help detect colon cancer. Prescriptions: PROCTOFOAM HC 1-1 % FOAM (HYDROCORTISONE ACE-PRAMOXINE) Insert as directed two times a day as needed for hemorrhoid pain  #1 can x 2   Entered and Authorized by:   Etta Grandchild MD   Signed by:   Etta Grandchild MD on 01/12/2011   Method used:    Electronically to        [facility] [address] [phone]* (retail)       [address]       [place], [place]  [address]       Ph: [phone]       Fax: [phone]   RxID:   [id number] CIALIS 20 MG TABS (TADALAFIL) take one by mouth as directed  #3 x 11   Entered and Authorized by:   Etta Grandchild MD   Signed by:   Etta Grandchild MD on 01/12/2011   Method used:   Print then Give to Patient   RxID:   [id number]    Orders Added: 1)  TLB-BMP (Basic Metabolic Panel-BMET) [80048-METABOL] 2)  TLB-CBC Platelet - w/Differential [85025-CBCD] 3)  TLB-Hepatic/Liver Function Pnl [80076-HEPATIC] 4)  TLB-TSH (Thyroid Stimulating Hormone) [84443-TSH] 5)  TLB-Udip w/ Micro [81001-URINE] 6)  TLB-PSA (Prostate Specific Antigen) [60630-ZSW] 7)  Gastroenterology Referral [GI] 8)  Hemoccult Guaiac-1 spec.(in office) [82270] 9)  Est. Patient Level IV [10932]

## 2011-07-28 LAB — BASIC METABOLIC PANEL
BUN: 8
CO2: 27
Calcium: 9.4
Chloride: 103
Creatinine, Ser: 1.2
GFR calc Af Amer: >60
GFR calc non Af Amer: >60
Glucose, Bld: 84
Potassium: 4.2
Sodium: 139

## 2011-07-28 LAB — DIFFERENTIAL
Basophils Absolute: 0.1
Basophils Relative: 1
Eosinophils Absolute: 0.1
Eosinophils Relative: 1
Lymphocytes Relative: 24
Lymphs Abs: 1.2
Monocytes Absolute: 1.1 — ABNORMAL HIGH
Monocytes Relative: 22 — ABNORMAL HIGH
Neutro Abs: 2.7
Neutrophils Relative %: 52

## 2011-07-28 LAB — CBC
HCT: 50
Hemoglobin: 16.3
MCHC: 32.6
MCV: 91.9
Platelets: 190
RBC: 5.44
RDW: 12.8
WBC: 5.2

## 2011-07-28 LAB — POCT CARDIAC MARKERS
CKMB, poc: 1.4
Myoglobin, poc: 103
Troponin i, poc: <0.05

## 2011-07-28 LAB — D-DIMER, QUANTITATIVE: D-Dimer, Quant: 0.39

## 2011-09-24 ENCOUNTER — Ambulatory Visit (INDEPENDENT_AMBULATORY_CARE_PROVIDER_SITE_OTHER)
Admission: RE | Admit: 2011-09-24 | Discharge: 2011-09-24 | Disposition: A | Discharge status: Home / Self Care | Payer: BC Managed Care – PPO | Source: Ambulatory Visit | Attending: Internal Medicine | Admitting: Internal Medicine

## 2011-09-24 ENCOUNTER — Encounter: Payer: Self-pay | Admitting: Internal Medicine

## 2011-09-24 ENCOUNTER — Ambulatory Visit (INDEPENDENT_AMBULATORY_CARE_PROVIDER_SITE_OTHER): Payer: BC Managed Care – PPO | Admitting: Internal Medicine

## 2011-09-24 VITALS — BP 112/78 | HR 71 | Temp 97.2°F | Resp 16 | Wt 222.0 lb

## 2011-09-24 DIAGNOSIS — R059 Cough, unspecified: Secondary | ICD-10-CM

## 2011-09-24 DIAGNOSIS — R05 Cough: Secondary | ICD-10-CM

## 2011-09-24 DIAGNOSIS — J45909 Unspecified asthma, uncomplicated: Secondary | ICD-10-CM

## 2011-09-24 DIAGNOSIS — J209 Acute bronchitis, unspecified: Secondary | ICD-10-CM

## 2011-09-24 MED ORDER — PSEUDOEPH-CHLORPHEN-HYDROCOD 60-4-5 MG/5ML PO SOLN: 5.0000 mL | Freq: Four times a day (QID) | ORAL | Status: DC | PRN

## 2011-09-24 MED ORDER — METHYLPREDNISOLONE ACETATE 80 MG/ML IJ SUSP
120.0000 mg | Freq: Once | INTRAMUSCULAR | Status: AC
  Administered 2011-09-24: 120 mg via INTRAMUSCULAR

## 2011-09-24 MED ORDER — MOMETASONE FURO-FORMOTEROL FUM 100-5 MCG/ACT IN AERO: 2.0000 | INHALATION_SPRAY | Freq: Two times a day (BID) | RESPIRATORY_TRACT | Status: DC

## 2011-09-24 MED ORDER — MOXIFLOXACIN HCL 400 MG PO TABS: 400.0000 mg | ORAL_TABLET | Freq: Every day | ORAL | Status: AC

## 2011-09-24 NOTE — Assessment & Plan Note (Signed)
Start dulera bid

## 2011-09-24 NOTE — Patient Instructions (Signed)

## 2011-09-24 NOTE — Assessment & Plan Note (Signed)
Start Avelox for the infection and Zutripro for the cough and congestion

## 2011-09-24 NOTE — Progress Notes (Signed)
Subjective:    Patient ID: Matthew Franco, male    DOB: Oct 25, 1971, 40 y.o.   MRN: [id number]  Cough This is a new problem. The current episode started in the past 7 days. The problem has been gradually worsening. The problem occurs every few hours. The cough is productive of purulent sputum. Associated symptoms include chills, myalgias, nasal congestion, postnasal drip, sweats and wheezing. Pertinent negatives include no chest pain, ear congestion, ear pain, fever, headaches, heartburn, hemoptysis, rash, rhinorrhea, sore throat, shortness of breath or weight loss. The symptoms are aggravated by nothing. He has tried nothing for the symptoms. The treatment provided no relief. His past medical history is significant for asthma.  Asthma He complains of cough and wheezing. There is no hemoptysis or shortness of breath. This is a chronic problem. The current episode started more than 1 year ago. The problem occurs intermittently. The problem has been gradually worsening. The cough is productive of purulent sputum. Associated symptoms include myalgias, nasal congestion, postnasal drip and sweats. Pertinent negatives include no appetite change, chest pain, dyspnea on exertion, ear congestion, ear pain, fever, headaches, heartburn, malaise/fatigue, orthopnea, PND, rhinorrhea, sneezing, sore throat, trouble swallowing or weight loss. His symptoms are alleviated by nothing. He reports no improvement on treatment. Risk factors for lung disease include travel. His past medical history is significant for asthma.      Review of Systems  Constitutional: Positive for chills. Negative for fever, weight loss, malaise/fatigue, diaphoresis, activity change, appetite change, fatigue and unexpected weight change.  HENT: Positive for congestion and postnasal drip. Negative for ear pain, nosebleeds, sore throat, facial swelling, rhinorrhea, sneezing, trouble swallowing, neck pain, neck stiffness, voice change and sinus  pressure.   Eyes: Negative.   Respiratory: Positive for cough and wheezing. Negative for apnea, hemoptysis, choking, chest tightness, shortness of breath and stridor.   Cardiovascular: Negative for chest pain, dyspnea on exertion, palpitations, leg swelling and PND.  Gastrointestinal: Negative for heartburn, nausea, vomiting, abdominal pain, diarrhea and constipation.  Genitourinary: Negative for dysuria, urgency, frequency, hematuria, flank pain, decreased urine volume, enuresis, difficulty urinating and genital sores.  Musculoskeletal: Positive for myalgias. Negative for back pain, joint swelling, arthralgias and gait problem.  Skin: Negative for color change, pallor, rash and wound.  Neurological: Negative for dizziness, tremors, seizures, syncope, facial asymmetry, speech difficulty, weakness, light-headedness, numbness and headaches.  Hematological: Negative for adenopathy. Does not bruise/bleed easily.  Psychiatric/Behavioral: Negative.        Objective:   Physical Exam  Vitals reviewed. Constitutional: He is oriented to person, place, and time. He appears well-developed and well-nourished. No distress.  HENT:  Head: Normocephalic and atraumatic.  Mouth/Throat: Oropharynx is clear and moist. No oropharyngeal exudate.  Eyes: Conjunctivae are normal. Right eye exhibits no discharge. Left eye exhibits no discharge. No scleral icterus.  Neck: Normal range of motion. Neck supple. No JVD present. No tracheal deviation present. No thyromegaly present.  Cardiovascular: Normal rate, regular rhythm, normal heart sounds and intact distal pulses.  Exam reveals no gallop and no friction rub.   No murmur heard. Pulmonary/Chest: Effort normal. No accessory muscle usage or stridor. Not tachypneic. No respiratory distress. He has no decreased breath sounds. He has no wheezes. He has rhonchi in the right middle field and the left middle field. He has no rales. He exhibits no tenderness.  Abdominal:  Soft. Bowel sounds are normal. He exhibits no distension and no mass. There is no tenderness. There is no rebound and no guarding.  Musculoskeletal:  Normal range of motion. He exhibits no edema and no tenderness.  Lymphadenopathy:    He has no cervical adenopathy.  Neurological: He is oriented to person, place, and time.  Skin: Skin is warm and dry. No rash noted. He is not diaphoretic. No erythema. No pallor.  Psychiatric: He has a normal mood and affect. His behavior is normal. Judgment and thought content normal.          Assessment & Plan:

## 2011-09-24 NOTE — Assessment & Plan Note (Signed)
I will check a CXR to look for PNA 

## 2012-06-22 ENCOUNTER — Ambulatory Visit (INDEPENDENT_AMBULATORY_CARE_PROVIDER_SITE_OTHER): Payer: BC Managed Care – PPO | Admitting: Internal Medicine

## 2012-06-22 ENCOUNTER — Ambulatory Visit (INDEPENDENT_AMBULATORY_CARE_PROVIDER_SITE_OTHER)
Admission: RE | Admit: 2012-06-22 | Discharge: 2012-06-22 | Disposition: A | Discharge status: Home / Self Care | Payer: BC Managed Care – PPO | Source: Ambulatory Visit | Attending: Internal Medicine | Admitting: Internal Medicine

## 2012-06-22 ENCOUNTER — Encounter: Payer: Self-pay | Admitting: Internal Medicine

## 2012-06-22 VITALS — BP 122/70 | HR 71 | Temp 98.2°F | Resp 16 | Wt 226.8 lb

## 2012-06-22 DIAGNOSIS — M25529 Pain in unspecified elbow: Secondary | ICD-10-CM

## 2012-06-22 DIAGNOSIS — M25521 Pain in right elbow: Secondary | ICD-10-CM

## 2012-06-22 MED ORDER — NAPROXEN SODIUM ER 750 MG PO TB24: 1.0000 | ORAL_TABLET | Freq: Every day | ORAL | Status: DC

## 2012-06-22 NOTE — Patient Instructions (Signed)
Elbow Injury  You or your child has an elbow injury. X-rays and exam today do not show evidence of a fracture (broken bone). That means that only a sling or splint may be required for a brief period of time as directed by your caregiver.  HOME CARE INSTRUCTIONS   Only take over-the-counter or prescription medicines for pain, discomfort, or fever as directed by your caregiver.   If you have a splint held on with an elastic wrap or a cast, watch your hand or fingers. If they become numb or cold and blue, loosen the wrap and reapply more loosely. See your caregiver if there is no relief.   You may use ice on your elbow for 15 to 20 minutes, 3 to 4 times per day, for the first 2 to 3 days.   Use your elbow as directed.   See your caregiver as directed. It is very important to keep all follow-up referrals and appointments in order to avoid any long-term problems with your elbow including chronic pain or inability to move the elbow normally.  SEEK IMMEDIATE MEDICAL CARE IF:    There is swelling or increasing pain in your elbow which is not relieved with medications.   You begin to lose feeling in your hand or fingers, or develop swelling of the hand and fingers.   You get a cold or blue hand or fingers on injured side.   If your elbow remains sore, your caregiver may want to x-ray it again. A hairline fracture may not show up on the first x-rays and may only be seen on repeat x-rays ten days to two weeks later. A specialist (radiologist) may examine your x-rays at a later time. In order to get results from the radiologist or their department, make sure you know how and when you are to get that information. It is your responsibility to get results of any tests you may have had.  MAKE SURE YOU:    Understand these instructions.   Will watch your condition.   Will get help right away if you are not doing well or get worse.  Document Released: 01/02/2004 Document Revised: 10/01/2011 Document Reviewed:  05/30/2008  [employer] Patient Information 2012 [employer].

## 2012-06-22 NOTE — Assessment & Plan Note (Signed)
Will start nsaids and the xray shows some spurring so I have asked him to see ortho

## 2012-06-22 NOTE — Progress Notes (Signed)
  Subjective:    Patient ID: Matthew Franco, male    DOB: July 15, 1971, 41 y.o.   MRN: [id number]  Arthritis Presents for initial visit. The disease course has been worsening. The condition has lasted for 1 month. He complains of pain and stiffness. He reports no joint swelling or joint warmth. Affected locations include the right elbow. His pain is at a severity of 2/10. Associated symptoms include pain while resting. Pertinent negatives include no diarrhea, dry eyes, dry mouth, fatigue, fever, pain at night, rash, Raynaud's syndrome, uveitis or weight loss. His pertinent risk factors include overuse. Past treatments include nothing. Factors aggravating his arthritis include activity.      Review of Systems  Constitutional: Negative.  Negative for fever, weight loss and fatigue.  HENT: Negative.   Eyes: Negative.   Respiratory: Negative.   Cardiovascular: Negative.   Gastrointestinal: Negative.  Negative for diarrhea.  Genitourinary: Negative.   Musculoskeletal: Positive for arthralgias, arthritis and stiffness. Negative for myalgias, back pain, joint swelling and gait problem.  Skin: Negative.  Negative for rash.  Neurological: Negative.  Negative for weakness.  Hematological: Negative.   Psychiatric/Behavioral: Negative.        Objective:   Physical Exam  Musculoskeletal:       Right elbow: He exhibits normal range of motion, no swelling, no effusion, no deformity and no laceration. tenderness found. Radial head and olecranon process tenderness noted. No medial epicondyle and no lateral epicondyle tenderness noted.      Dg Elbow Complete Right  06/22/2012  *RADIOLOGY REPORT*  Clinical Data: 68-month history of right elbow pain.  No history of injury.  RIGHT ELBOW - COMPLETE 3+ VIEW  Comparison: None.  Findings: No evidence of joint effusion.  There is minimal spurring of the olecranon at the triceps insertion. Alignment is normal. Joint spaces are preserved.  No fracture or  dislocation is evident. No soft tissue lesions are seen.  IMPRESSION: Minimal spurring of the olecranon at the triceps insertion.  Normal otherwise.   Original Report Authenticated By: Crawford Givens, M.D.       Assessment & Plan:

## 2015-08-06 ENCOUNTER — Telehealth: Payer: Self-pay | Admitting: Internal Medicine

## 2015-08-06 ENCOUNTER — Encounter: Payer: Self-pay | Admitting: Internal Medicine

## 2015-08-06 NOTE — Telephone Encounter (Signed)
Ok with me 

## 2015-08-06 NOTE — Telephone Encounter (Signed)
Got patient scheduled

## 2015-08-06 NOTE — Telephone Encounter (Signed)
It has been three years since patient has been in.  Wife would like to know if patient can continue care?

## 2015-08-14 ENCOUNTER — Ambulatory Visit (INDEPENDENT_AMBULATORY_CARE_PROVIDER_SITE_OTHER): Payer: 59 | Admitting: Internal Medicine

## 2015-08-14 ENCOUNTER — Other Ambulatory Visit (INDEPENDENT_AMBULATORY_CARE_PROVIDER_SITE_OTHER): Payer: 59

## 2015-08-14 ENCOUNTER — Encounter: Payer: Self-pay | Admitting: Internal Medicine

## 2015-08-14 VITALS — BP 120/80 | HR 80 | Temp 99.0°F | Resp 16 | Ht 70.0 in | Wt 222.0 lb

## 2015-08-14 DIAGNOSIS — M722 Plantar fascial fibromatosis: Secondary | ICD-10-CM

## 2015-08-14 DIAGNOSIS — R7989 Other specified abnormal findings of blood chemistry: Secondary | ICD-10-CM

## 2015-08-14 DIAGNOSIS — R739 Hyperglycemia, unspecified: Secondary | ICD-10-CM | POA: Insufficient documentation

## 2015-08-14 DIAGNOSIS — Z0000 Encounter for general adult medical examination without abnormal findings: Secondary | ICD-10-CM | POA: Diagnosis not present

## 2015-08-14 DIAGNOSIS — E785 Hyperlipidemia, unspecified: Secondary | ICD-10-CM

## 2015-08-14 DIAGNOSIS — Z23 Encounter for immunization: Secondary | ICD-10-CM

## 2015-08-14 DIAGNOSIS — M79671 Pain in right foot: Secondary | ICD-10-CM | POA: Insufficient documentation

## 2015-08-14 LAB — CBC WITH DIFFERENTIAL/PLATELET
Basophils Absolute: 0 10*3/uL (ref 0.0–0.1)
Basophils Relative: 0.5 % (ref 0.0–3.0)
Eosinophils Absolute: 0.1 10*3/uL (ref 0.0–0.7)
Eosinophils Relative: 1.4 % (ref 0.0–5.0)
HCT: 46.8 % (ref 39.0–52.0)
Hemoglobin: 15.6 g/dL (ref 13.0–17.0)
Lymphocytes Relative: 38.7 % (ref 12.0–46.0)
Lymphs Abs: 2.7 10*3/uL (ref 0.7–4.0)
MCHC: 33.4 g/dL (ref 30.0–36.0)
MCV: 91.4 fl (ref 78.0–100.0)
Monocytes Absolute: 0.5 10*3/uL (ref 0.1–1.0)
Monocytes Relative: 7.6 % (ref 3.0–12.0)
Neutro Abs: 3.7 10*3/uL (ref 1.4–7.7)
Neutrophils Relative %: 51.8 % (ref 43.0–77.0)
Platelets: 242 10*3/uL (ref 150.0–400.0)
RBC: 5.12 Mil/uL (ref 4.22–5.81)
RDW: 13.8 % (ref 11.5–15.5)
WBC: 7.1 10*3/uL (ref 4.0–10.5)

## 2015-08-14 LAB — COMPREHENSIVE METABOLIC PANEL
ALT: 17 U/L (ref 0–53)
AST: 21 U/L (ref 0–37)
Albumin: 4.1 g/dL (ref 3.5–5.2)
Alkaline Phosphatase: 86 U/L (ref 39–117)
BUN: 10 mg/dL (ref 6–23)
CO2: 28 mEq/L (ref 19–32)
Calcium: 9.3 mg/dL (ref 8.4–10.5)
Chloride: 102 mEq/L (ref 96–112)
Creatinine, Ser: 1.17 mg/dL (ref 0.40–1.50)
GFR: 86.57 mL/min (ref >60.00)
Glucose, Bld: 91 mg/dL (ref 70–99)
Potassium: 4 mEq/L (ref 3.5–5.1)
Sodium: 138 mEq/L (ref 135–145)
Total Bilirubin: 0.5 mg/dL (ref 0.2–1.2)
Total Protein: 7.2 g/dL (ref 6.0–8.3)

## 2015-08-14 LAB — HEMOGLOBIN A1C: Hgb A1c MFr Bld: 6 % (ref 4.6–6.5)

## 2015-08-14 LAB — URINALYSIS, ROUTINE W REFLEX MICROSCOPIC
Bilirubin Urine: NEGATIVE
Ketones, ur: NEGATIVE
Leukocytes, UA: NEGATIVE
Nitrite: NEGATIVE
Specific Gravity, Urine: 1.01 (ref 1.000–1.030)
Total Protein, Urine: NEGATIVE
Urine Glucose: NEGATIVE
Urobilinogen, UA: 0.2 (ref 0.0–1.0)
pH: 6 (ref 5.0–8.0)

## 2015-08-14 LAB — LIPID PANEL
Cholesterol: 247 mg/dL — ABNORMAL HIGH (ref 0–200)
HDL: 35.1 mg/dL — ABNORMAL LOW (ref >39.00)
NonHDL: 212.28
Total CHOL/HDL Ratio: 7
Triglycerides: 216 mg/dL — ABNORMAL HIGH (ref 0.0–149.0)
VLDL: 43.2 mg/dL — ABNORMAL HIGH (ref 0.0–40.0)

## 2015-08-14 LAB — FECAL OCCULT BLOOD, GUAIAC: Fecal Occult Blood: NEGATIVE

## 2015-08-14 LAB — TSH: TSH: 0.61 u[IU]/mL (ref 0.35–4.50)

## 2015-08-14 LAB — LDL CHOLESTEROL, DIRECT: Direct LDL: 169 mg/dL

## 2015-08-14 LAB — PSA: PSA: 1.28 ng/mL (ref 0.10–4.00)

## 2015-08-14 MED ORDER — MELOXICAM 15 MG PO TABS: 15.0000 mg | ORAL_TABLET | Freq: Every day | ORAL | Status: DC

## 2015-08-14 NOTE — Progress Notes (Signed)
Subjective:  Patient ID: Matthew Franco, male    DOB: Apr 23, 1971  Age: 44 y.o. MRN: [id number]  CC: Annual Exam and Foot Pain   HPI Matthew Franco presents for a CPX but he also complains of right foot pain for several weeks, there was no prior trauma or injury. He feels a stinging pain on the side of his right foot when he gets up in the morning and starts walking. He has not taken anything for the pain.  Outpatient Prescriptions Prior to Visit  Medication Sig Dispense Refill  . Naproxen Sodium (NAPRELAN) 750 MG TB24 Take 1 tablet (750 mg total) by mouth daily. 7 each 0   No facility-administered medications prior to visit.    ROS Review of Systems  Constitutional: Negative.  Negative for fever, chills, diaphoresis, appetite change and fatigue.  HENT: Negative.   Eyes: Negative.   Respiratory: Negative.  Negative for cough, choking, chest tightness, shortness of breath and stridor.   Cardiovascular: Negative.  Negative for chest pain, palpitations and leg swelling.  Gastrointestinal: Negative.  Negative for nausea, vomiting, abdominal pain, diarrhea, constipation and blood in stool.  Endocrine: Negative.   Genitourinary: Negative.  Negative for difficulty urinating.  Musculoskeletal: Positive for arthralgias (rt foot only). Negative for myalgias, back pain, joint swelling, gait problem, neck pain and neck stiffness.  Skin: Negative.   Allergic/Immunologic: Negative.   Neurological: Negative.  Negative for dizziness, syncope, light-headedness and numbness.  Hematological: Negative.  Negative for adenopathy. Does not bruise/bleed easily.  Psychiatric/Behavioral: Negative.     Objective:  BP 120/80 mmHg  Pulse 80  Temp(Src) 99 F (37.2 C) (Oral)  Resp 16  Ht  (1.778 m)  Wt 222 lb (100.699 kg)  BMI 31.85 kg/m2  SpO2 96%  BP Readings from Last 3 Encounters:  08/14/15 120/80  06/22/12 122/70  09/24/11 112/78    Wt Readings from Last 3 Encounters:  08/14/15 222  lb (100.699 kg)  06/22/12 226 lb 13 oz (102.881 kg)  09/24/11 222 lb (100.699 kg)    Physical Exam  Constitutional: He is oriented to person, place, and time. He appears well-developed and well-nourished. No distress.  HENT:  Head: Normocephalic and atraumatic.  Mouth/Throat: Oropharynx is clear and moist. No oropharyngeal exudate.  Eyes: Conjunctivae are normal. Right eye exhibits no discharge. Left eye exhibits no discharge. No scleral icterus.  Neck: Normal range of motion. Neck supple. No JVD present. No tracheal deviation present. No thyromegaly present.  Cardiovascular: Normal rate, regular rhythm, normal heart sounds and intact distal pulses.  Exam reveals no gallop and no friction rub.   No murmur heard. Pulses:      Carotid pulses are 1+ on the right side, and 1+ on the left side.      Radial pulses are 1+ on the right side, and 1+ on the left side.       Femoral pulses are 1+ on the right side, and 1+ on the left side.      Popliteal pulses are 1+ on the right side, and 1+ on the left side.       Dorsalis pedis pulses are 1+ on the right side, and 1+ on the left side.       Posterior tibial pulses are 1+ on the right side, and 1+ on the left side.  Pulmonary/Chest: Effort normal and breath sounds normal. No stridor. No respiratory distress. He has no wheezes. He has no rales. He exhibits no tenderness.  Abdominal: Soft.  Bowel sounds are normal. He exhibits no distension and no mass. There is no tenderness. There is no rebound and no guarding. Hernia confirmed negative in the right inguinal area and confirmed negative in the left inguinal area.  Genitourinary: Rectum normal, prostate normal, testes normal and penis normal. Rectal exam shows no external hemorrhoid, no internal hemorrhoid, no fissure, no mass and no tenderness. Guaiac negative stool. Prostate is not enlarged and not tender. Right testis shows no mass, no swelling and no tenderness. Right testis is descended. Left testis  shows no mass, no swelling and no tenderness. Left testis is descended. Circumcised. No penile erythema or penile tenderness. No discharge found.  Musculoskeletal: Normal range of motion. He exhibits no edema or tenderness.       Right foot: Normal. There is normal range of motion, no tenderness, no bony tenderness, no swelling, normal capillary refill, no crepitus, no deformity and no laceration.  Lymphadenopathy:    He has no cervical adenopathy.       Right: No inguinal adenopathy present.       Left: No inguinal adenopathy present.  Neurological: He is oriented to person, place, and time.  Skin: Skin is warm and dry. No rash noted. He is not diaphoretic. No erythema. No pallor.  Psychiatric: He has a normal mood and affect. His behavior is normal. Judgment and thought content normal.  Vitals reviewed.   Lab Results  Component Value Date   WBC 7.1 08/14/2015   HGB 15.6 08/14/2015   HCT 46.8 08/14/2015   PLT 242.0 08/14/2015   GLUCOSE 91 08/14/2015   CHOL 247* 08/14/2015   TRIG 216.0* 08/14/2015   HDL 35.10* 08/14/2015   LDLDIRECT 169.0 08/14/2015   ALT 17 08/14/2015   AST 21 08/14/2015   NA 138 08/14/2015   K 4.0 08/14/2015   CL 102 08/14/2015   CREATININE 1.17 08/14/2015   BUN 10 08/14/2015   CO2 28 08/14/2015   TSH 0.61 08/14/2015   PSA 1.28 08/14/2015   HGBA1C 6.0 08/14/2015    Dg Elbow Complete Right  06/22/2012  *RADIOLOGY REPORT* Clinical Data: 53-month history of right elbow pain.  No history of injury. RIGHT ELBOW - COMPLETE 3+ VIEW Comparison: None. Findings: No evidence of joint effusion.  There is minimal spurring of the olecranon at the triceps insertion. Alignment is normal. Joint spaces are preserved.  No fracture or dislocation is evident. No soft tissue lesions are seen. IMPRESSION: Minimal spurring of the olecranon at the triceps insertion.  Normal otherwise. Original Report Authenticated By: Crawford Givens, M.D.    Assessment & Plan:   Matthew Franco was seen today  for annual exam and foot pain.  Diagnoses and all orders for this visit:  Routine general medical examination at a health care facility- exam completed, labs ordered and reviewed, vaccines reviewed and updated, education education material was given. -     Lipid panel; Future -     Comprehensive metabolic panel; Future -     CBC with Differential/Platelet; Future -     TSH; Future -     PSA; Future -     Urinalysis, Routine w reflex microscopic (not at [facility]); Future  Hyperglycemia- he has prediabetes and agrees to work on his lifestyle modifications. -     Hemoglobin A1c; Future  Right foot pain- the exam and x-rays are normal, he appears to have plantar fasciitis. -     DG Foot Complete Right; Future -     meloxicam (MOBIC) 15 MG  tablet; Take 1 tablet (15 mg total) by mouth daily.  Plantar fasciitis of right foot- he was given patient education material for icing and stretching of the right foot. He will also start an anti-inflammatory. If the symptoms do not improve he will let me know and I will refer him to a podiatrist for further evaluation. -     meloxicam (MOBIC) 15 MG tablet; Take 1 tablet (15 mg total) by mouth daily.  Need for Tdap vaccination -     Tdap vaccine greater than or equal to 7yo IM  Hyperlipidemia with target LDL less than 160- his Framingham risk score is 8%. At this time he does not want to start taking a statin.  I have discontinued Mr. Espe's Naproxen Sodium and Naproxen Sodium (ALEVE PO). I am also having him start on meloxicam.  Meds ordered this encounter  Medications  . DISCONTD: Naproxen Sodium (ALEVE PO)    Sig: Take 200 mg by mouth.  . meloxicam (MOBIC) 15 MG tablet    Sig: Take 1 tablet (15 mg total) by mouth daily.    Dispense:  90 tablet    Refill:  1     Follow-up: Return in about 2 months (around 10/14/2015).  Sanda Lingerhomas Jones, MD

## 2015-08-14 NOTE — Patient Instructions (Signed)
Plantar Fasciitis Plantar fasciitis is a painful foot condition that affects the heel. It occurs when the band of tissue that connects the toes to the heel bone (plantar fascia) becomes irritated. This can happen after exercising too much or doing other repetitive activities (overuse injury). The pain from plantar fasciitis can range from mild irritation to severe pain that makes it difficult for you to walk or move. The pain is usually worse in the morning or after you have been sitting or lying down for a while. CAUSES This condition may be caused by:  Standing for long periods of time.  Wearing shoes that do not fit.  Doing high-impact activities, including running, aerobics, and ballet.  Being overweight.  Having an abnormal way of walking (gait).  Having tight calf muscles.  Having high arches in your feet.  Starting a new athletic activity. SYMPTOMS The main symptom of this condition is heel pain. Other symptoms include:  Pain that gets worse after activity or exercise.  Pain that is worse in the morning or after resting.  Pain that goes away after you walk for a few minutes. DIAGNOSIS This condition may be diagnosed based on your signs and symptoms. Your health care provider will also do a physical exam to check for:  A tender area on the bottom of your foot.  A high arch in your foot.  Pain when you move your foot.  Difficulty moving your foot. You may also need to have imaging studies to confirm the diagnosis. These can include:  X-rays.  Ultrasound.  MRI. TREATMENT  Treatment for plantar fasciitis depends on the severity of the condition. Your treatment may include:  Rest, ice, and over-the-counter pain medicines to manage your pain.  Exercises to stretch your calves and your plantar fascia.  A splint that holds your foot in a stretched, upward position while you sleep (night splint).  Physical therapy to relieve symptoms and prevent problems in the  future.  Cortisone injections to relieve severe pain.  Extracorporeal shock wave therapy (ESWT) to stimulate damaged plantar fascia with electrical impulses. It is often used as a last resort before surgery.  Surgery, if other treatments have not worked after 12 months. HOME CARE INSTRUCTIONS  Take medicines only as directed by your health care provider.  Avoid activities that cause pain.  Roll the bottom of your foot over a bag of ice or a bottle of cold water. Do this for 20 minutes, 3-4 times a day.  Perform simple stretches as directed by your health care provider.  Try wearing athletic shoes with air-sole or gel-sole cushions or soft shoe inserts.  Wear a night splint while sleeping, if directed by your health care provider.  Keep all follow-up appointments with your health care provider. PREVENTION   Do not perform exercises or activities that cause heel pain.  Consider finding low-impact activities if you continue to have problems.  Lose weight if you need to. The best way to prevent plantar fasciitis is to avoid the activities that aggravate your plantar fascia. SEEK MEDICAL CARE IF:  Your symptoms do not go away after treatment with home care measures.  Your pain gets worse.  Your pain affects your ability to move or do your daily activities.   This information is not intended to replace advice given to you by your health care provider. Make sure you discuss any questions you have with your health care provider.   Document Released: 07/07/2001 Document Revised: 07/03/2015 Document Reviewed: 08/22/2014 Elsevier   Interactive Patient Education 2016 Elsevier Inc.  

## 2015-08-14 NOTE — Progress Notes (Signed)
Pre visit review using our clinic review tool, if applicable. No additional management support is needed unless otherwise documented below in the visit note. 

## 2015-08-15 ENCOUNTER — Encounter: Payer: Self-pay | Admitting: Internal Medicine

## 2015-10-14 ENCOUNTER — Encounter: Payer: Self-pay | Admitting: Internal Medicine

## 2015-10-14 ENCOUNTER — Ambulatory Visit (INDEPENDENT_AMBULATORY_CARE_PROVIDER_SITE_OTHER)
Admission: RE | Admit: 2015-10-14 | Discharge: 2015-10-14 | Disposition: A | Discharge status: Home / Self Care | Payer: 59 | Source: Ambulatory Visit | Attending: Internal Medicine | Admitting: Internal Medicine

## 2015-10-14 ENCOUNTER — Ambulatory Visit (INDEPENDENT_AMBULATORY_CARE_PROVIDER_SITE_OTHER): Payer: 59 | Admitting: Internal Medicine

## 2015-10-14 VITALS — BP 126/70 | HR 85 | Temp 98.4°F | Resp 16 | Ht 70.0 in | Wt 224.8 lb

## 2015-10-14 DIAGNOSIS — M79671 Pain in right foot: Secondary | ICD-10-CM

## 2015-10-14 NOTE — Patient Instructions (Signed)
Plantar Fasciitis Plantar fasciitis is a painful foot condition that affects the heel. It occurs when the band of tissue that connects the toes to the heel bone (plantar fascia) becomes irritated. This can happen after exercising too much or doing other repetitive activities (overuse injury). The pain from plantar fasciitis can range from mild irritation to severe pain that makes it difficult for you to walk or move. The pain is usually worse in the morning or after you have been sitting or lying down for a while. CAUSES This condition may be caused by:  Standing for long periods of time.  Wearing shoes that do not fit.  Doing high-impact activities, including running, aerobics, and ballet.  Being overweight.  Having an abnormal way of walking (gait).  Having tight calf muscles.  Having high arches in your feet.  Starting a new athletic activity. SYMPTOMS The main symptom of this condition is heel pain. Other symptoms include:  Pain that gets worse after activity or exercise.  Pain that is worse in the morning or after resting.  Pain that goes away after you walk for a few minutes. DIAGNOSIS This condition may be diagnosed based on your signs and symptoms. Your health care provider will also do a physical exam to check for:  A tender area on the bottom of your foot.  A high arch in your foot.  Pain when you move your foot.  Difficulty moving your foot. You may also need to have imaging studies to confirm the diagnosis. These can include:  X-rays.  Ultrasound.  MRI. TREATMENT  Treatment for plantar fasciitis depends on the severity of the condition. Your treatment may include:  Rest, ice, and over-the-counter pain medicines to manage your pain.  Exercises to stretch your calves and your plantar fascia.  A splint that holds your foot in a stretched, upward position while you sleep (night splint).  Physical therapy to relieve symptoms and prevent problems in the  future.  Cortisone injections to relieve severe pain.  Extracorporeal shock wave therapy (ESWT) to stimulate damaged plantar fascia with electrical impulses. It is often used as a last resort before surgery.  Surgery, if other treatments have not worked after 12 months. HOME CARE INSTRUCTIONS  Take medicines only as directed by your health care provider.  Avoid activities that cause pain.  Roll the bottom of your foot over a bag of ice or a bottle of cold water. Do this for 20 minutes, 3-4 times a day.  Perform simple stretches as directed by your health care provider.  Try wearing athletic shoes with air-sole or gel-sole cushions or soft shoe inserts.  Wear a night splint while sleeping, if directed by your health care provider.  Keep all follow-up appointments with your health care provider. PREVENTION   Do not perform exercises or activities that cause heel pain.  Consider finding low-impact activities if you continue to have problems.  Lose weight if you need to. The best way to prevent plantar fasciitis is to avoid the activities that aggravate your plantar fascia. SEEK MEDICAL CARE IF:  Your symptoms do not go away after treatment with home care measures.  Your pain gets worse.  Your pain affects your ability to move or do your daily activities.   This information is not intended to replace advice given to you by your health care provider. Make sure you discuss any questions you have with your health care provider.   Document Released: 07/07/2001 Document Revised: 07/03/2015 Document Reviewed: 08/22/2014 Elsevier   Interactive Patient Education 2016 Elsevier Inc.  

## 2015-10-15 ENCOUNTER — Telehealth: Payer: Self-pay | Admitting: Internal Medicine

## 2015-10-15 NOTE — Telephone Encounter (Signed)
Pt called back and I informed him of Dr. Yetta BarreJones notes on his xray

## 2015-10-15 NOTE — Progress Notes (Signed)
Subjective:  Patient ID: Matthew Franco, male    DOB: 03-Nov-1970  Age: 44 y.o. MRN: [id number]  CC: Foot Pain   HPI Matthew Franco presents for a 2 month follow-up on right foot pain. He was previously diagnosed with plantar fasciitis and is been taking meloxicam once a day. He reports significant improvement in his right foot pain. He still has mild discomfort in the middle of the foot between the plantar side and the ankle. He never notices any swelling.  Outpatient Prescriptions Prior to Visit  Medication Sig Dispense Refill  . meloxicam (MOBIC) 15 MG tablet Take 1 tablet (15 mg total) by mouth daily. 90 tablet 1   No facility-administered medications prior to visit.    ROS Review of Systems  Constitutional: Negative.  Negative for fever, chills and diaphoresis.  HENT: Negative.   Eyes: Negative.   Respiratory: Negative.  Negative for cough, choking, chest tightness, shortness of breath and stridor.   Cardiovascular: Negative.  Negative for chest pain, palpitations and leg swelling.  Gastrointestinal: Negative.  Negative for abdominal pain.  Endocrine: Negative.   Genitourinary: Negative.   Musculoskeletal: Positive for arthralgias. Negative for myalgias, back pain and neck pain.  Skin: Negative.  Negative for rash.  Allergic/Immunologic: Negative.   Neurological: Negative.  Negative for dizziness.  Hematological: Negative.  Negative for adenopathy. Does not bruise/bleed easily.  Psychiatric/Behavioral: Negative.     Objective:  BP 126/70 mmHg  Pulse 85  Temp(Src) 98.4 F (36.9 C) (Oral)  Resp 16  Ht 5\' 10"  (1.778 m)  Wt 224 lb 12 oz (101.946 kg)  BMI 32.25 kg/m2  SpO2 95%  BP Readings from Last 3 Encounters:  10/14/15 126/70  08/14/15 120/80  06/22/12 122/70    Wt Readings from Last 3 Encounters:  10/14/15 224 lb 12 oz (101.946 kg)  08/14/15 222 lb (100.699 kg)  06/22/12 226 lb 13 oz (102.881 kg)    Physical Exam  Musculoskeletal:       Right ankle:  Normal. He exhibits normal range of motion, no swelling, no ecchymosis, no deformity, no laceration and normal pulse. No tenderness. Achilles tendon normal.       Right foot: Normal. There is normal range of motion, no tenderness, no bony tenderness, no swelling, normal capillary refill, no crepitus, no deformity and no laceration.    Lab Results  Component Value Date   WBC 7.1 08/14/2015   HGB 15.6 08/14/2015   HCT 46.8 08/14/2015   PLT 242.0 08/14/2015   GLUCOSE 91 08/14/2015   CHOL 247* 08/14/2015   TRIG 216.0* 08/14/2015   HDL 35.10* 08/14/2015   LDLDIRECT 169.0 08/14/2015   ALT 17 08/14/2015   AST 21 08/14/2015   NA 138 08/14/2015   K 4.0 08/14/2015   CL 102 08/14/2015   CREATININE 1.17 08/14/2015   BUN 10 08/14/2015   CO2 28 08/14/2015   TSH 0.61 08/14/2015   PSA 1.28 08/14/2015   HGBA1C 6.0 08/14/2015    Dg Foot Complete Right  10/15/2015  CLINICAL DATA:  Medial foot pain.  No known injury. EXAM: RIGHT FOOT COMPLETE - 3+ VIEW COMPARISON:  06/06/2010. FINDINGS: No acute bony or joint abnormality identified. No evidence of fracture or dislocation. IMPRESSION: No acute abnormality P Electronically Signed   By: Maisie Fushomas  Register   On: 10/15/2015 08:08    Assessment & Plan:   [name] was seen today for foot pain.  Diagnoses and all orders for this visit:  Right foot pain- his examination  and x-ray are both normal. He has responded well to meloxicam, will continue. -     DG Foot Complete Right; Future   I am having Mr. Hnat maintain his meloxicam.  No orders of the defined types were placed in this encounter.     Follow-up: Return in about 3 months (around 01/12/2016).  Sanda Linger, MD

## 2017-04-19 ENCOUNTER — Encounter: Payer: Self-pay | Admitting: Family Medicine

## 2017-04-19 ENCOUNTER — Ambulatory Visit (INDEPENDENT_AMBULATORY_CARE_PROVIDER_SITE_OTHER): Payer: 59 | Admitting: Family Medicine

## 2017-04-19 VITALS — BP 146/88 | HR 67 | Temp 98.2°F | Resp 14 | Ht 70.0 in | Wt 208.0 lb

## 2017-04-19 DIAGNOSIS — J22 Unspecified acute lower respiratory infection: Secondary | ICD-10-CM

## 2017-04-19 MED ORDER — AZITHROMYCIN 250 MG PO TABS: ORAL_TABLET | ORAL | 0 refills | Status: DC

## 2017-04-19 MED ORDER — PREDNISONE 20 MG PO TABS: 20.0000 mg | ORAL_TABLET | Freq: Every day | ORAL | 0 refills | Status: DC

## 2017-04-19 MED ORDER — ALBUTEROL SULFATE HFA 108 (90 BASE) MCG/ACT IN AERS: 2.0000 | INHALATION_SPRAY | RESPIRATORY_TRACT | 1 refills | Status: DC | PRN

## 2017-04-19 NOTE — Patient Instructions (Signed)
Upper Respiratory Infection, Adult Most upper respiratory infections (URIs) are a viral infection of the air passages leading to the lungs. A URI affects the nose, throat, and upper air passages. The most common type of URI is nasopharyngitis and is typically referred to as "the common cold." URIs run their course and usually go away on their own. Most of the time, a URI does not require medical attention, but sometimes a bacterial infection in the upper airways can follow a viral infection. This is called a secondary infection. Sinus and middle ear infections are common types of secondary upper respiratory infections. Bacterial pneumonia can also complicate a URI. A URI can worsen asthma and chronic obstructive pulmonary disease (COPD). Sometimes, these complications can require emergency medical care and may be life threatening. What are the causes? Almost all URIs are caused by viruses. A virus is a type of germ and can spread from one person to another. What increases the risk? You may be at risk for a URI if:  You smoke.  You have chronic heart or lung disease.  You have a weakened defense (immune) system.  You are very young or very old.  You have nasal allergies or asthma.  You work in crowded or poorly ventilated areas.  You work in health care facilities or schools.  What are the signs or symptoms? Symptoms typically develop 2-3 days after you come in contact with a cold virus. Most viral URIs last 7-10 days. However, viral URIs from the influenza virus (flu virus) can last 14-18 days and are typically more severe. Symptoms may include:  Runny or stuffy (congested) nose.  Sneezing.  Cough.  Sore throat.  Headache.  Fatigue.  Fever.  Loss of appetite.  Pain in your forehead, behind your eyes, and over your cheekbones (sinus pain).  Muscle aches.  How is this diagnosed? Your health care provider may diagnose a URI by:  Physical exam.  Tests to check that your  symptoms are not due to another condition such as: ? Strep throat. ? Sinusitis. ? Pneumonia. ? Asthma.  How is this treated? A URI goes away on its own with time. It cannot be cured with medicines, but medicines may be prescribed or recommended to relieve symptoms. Medicines may help:  Reduce your fever.  Reduce your cough.  Relieve nasal congestion.  Follow these instructions at home:  Take medicines only as directed by your health care provider.  Gargle warm saltwater or take cough drops to comfort your throat as directed by your health care provider.  Use a warm mist humidifier or inhale steam from a shower to increase air moisture. This may make it easier to breathe.  Drink enough fluid to keep your urine clear or pale yellow.  Eat soups and other clear broths and maintain good nutrition.  Rest as needed.  Return to work when your temperature has returned to normal or as your health care provider advises. You may need to stay home longer to avoid infecting others. You can also use a face mask and careful hand washing to prevent spread of the virus.  Increase the usage of your inhaler if you have asthma.  Do not use any tobacco products, including cigarettes, chewing tobacco, or electronic cigarettes. If you need help quitting, ask your health care provider. How is this prevented? The best way to protect yourself from getting a cold is to practice good hygiene.  Avoid oral or hand contact with people with cold symptoms.  Wash your   hands often if contact occurs.  There is no clear evidence that vitamin C, vitamin E, echinacea, or exercise reduces the chance of developing a cold. However, it is always recommended to get plenty of rest, exercise, and practice good nutrition. Contact a health care provider if:  You are getting worse rather than better.  Your symptoms are not controlled by medicine.  You have chills.  You have worsening shortness of breath.  You have  brown or red mucus.  You have yellow or brown nasal discharge.  You have pain in your face, especially when you bend forward.  You have a fever.  You have swollen neck glands.  You have pain while swallowing.  You have Borin areas in the back of your throat. Get help right away if:  You have severe or persistent: ? Headache. ? Ear pain. ? Sinus pain. ? Chest pain.  You have chronic lung disease and any of the following: ? Wheezing. ? Prolonged cough. ? Coughing up blood. ? A change in your usual mucus.  You have a stiff neck.  You have changes in your: ? Vision. ? Hearing. ? Thinking. ? Mood. This information is not intended to replace advice given to you by your health care provider. Make sure you discuss any questions you have with your health care provider. Document Released: 04/07/2001 Document Revised: 06/14/2016 Document Reviewed: 01/17/2014 Elsevier Interactive Patient Education  2017 Elsevier Inc.  

## 2017-04-19 NOTE — Progress Notes (Signed)
Subjective:    Patient ID: Matthew Franco, male    DOB: 06/20/1971, 46 y.o.   MRN: [id number]  HPI This is a 46 yo male who presents today with cough x 5 days, had been outside. Had fever/chills/fatigued. After several days started to have chest and nasal congestion. Has history of asthma but no recent symptoms. Used his daughter's inhaler which improved symptoms. Took some Mucinex which loosened cough.  Little nasal congestion, no ear pain or sore throat. Sputum is light yellow-[place]. No fevers recently. Some wheezing, occasional SOB. Feels much better today than yesterday. Family members also ill.    Past Medical History:  Diagnosis Date  . Allergy   . Asthma   . GERD (gastroesophageal reflux disease)   . Hyperlipidemia    Past Surgical History:  Procedure Laterality Date  . KNEE SURGERY     Family History  Problem Relation Age of Onset  . Alcohol abuse Other   . Cancer Other        Prostate,colon, and breast cancer   Social History  Substance Use Topics  . Smoking status: Former Smoker    Quit date: 07/25/2011  . Smokeless tobacco: Never Used  . Alcohol use No      Review of Systems Per HPI    Objective:   Physical Exam  Constitutional: He is oriented to person, place, and time. He appears well-developed and well-nourished. No distress.  Appears mildly ill.   HENT:  Head: Normocephalic and atraumatic.  Right Ear: Tympanic membrane, external ear and ear canal normal.  Left Ear: Tympanic membrane, external ear and ear canal normal.  Nose: Nose normal.  Mouth/Throat: Uvula is midline. Posterior oropharyngeal erythema present. No oropharyngeal exudate or posterior oropharyngeal edema.  Eyes: Conjunctivae are normal.  Neck: Normal range of motion. Neck supple.  Cardiovascular: Normal rate, regular rhythm and normal heart sounds.   Pulmonary/Chest: Effort normal.  Scattered expiratory wheezes anteriorly and posteriorly. Occasional deep cough.   Lymphadenopathy:    He has no cervical adenopathy.  Neurological: He is alert and oriented to person, place, and time.  Skin: Skin is warm and dry. He is not diaphoretic.  Psychiatric: He has a normal mood and affect. His behavior is normal. Judgment and thought content normal.  Vitals reviewed.     BP (!) 152/90 (BP Location: Left Arm, Patient Position: Sitting, Cuff Size: Normal)   Pulse 67   Temp 98.2 F (36.8 C) (Oral)   Resp 14   Ht 5\' 10"  (1.778 m)   Wt 208 lb (94.3 kg)   SpO2 98%   BMI 29.84 kg/m  Wt Readings from Last 3 Encounters:  04/19/17 208 lb (94.3 kg)  10/14/15 224 lb 12 oz (101.9 kg)  08/14/15 222 lb (100.7 kg)   Recheck BP 146/88 BP Readings from Last 3 Encounters:  04/19/17 (!) 146/88  10/14/15 126/70  08/14/15 120/80       Assessment & Plan:  1. Lower respiratory infection - Provided written and verbal information regarding diagnosis and treatment. - RTC precautions reviewed - predniSONE (DELTASONE) 20 MG tablet; Take 1 tablet (20 mg total) by mouth daily with breakfast.  Dispense: 5 tablet; Refill: 0 - albuterol (PROVENTIL HFA;VENTOLIN HFA) 108 (90 Base) MCG/ACT inhaler; Inhale 2 puffs into the lungs every 4 (four) hours as needed for wheezing or shortness of breath (cough, shortness of breath or wheezing.).  Dispense: 1 Inhaler; Refill: 1 - azithromycin (ZITHROMAX) 250 MG tablet; Take 2 tabs PO x 1 dose,  then 1 tab PO QD x 4 days  Dispense: 6 tablet; Refill: 0   Olean Ree, FNP-BC  [facility] [place], [facility]  04/21/2017 1:04 PM

## 2019-01-09 ENCOUNTER — Encounter: Payer: Self-pay | Admitting: Internal Medicine

## 2019-01-09 ENCOUNTER — Ambulatory Visit (INDEPENDENT_AMBULATORY_CARE_PROVIDER_SITE_OTHER): Payer: 59 | Admitting: Internal Medicine

## 2019-01-09 ENCOUNTER — Other Ambulatory Visit: Payer: Self-pay

## 2019-01-09 VITALS — BP 124/88 | HR 100 | Temp 98.5°F | Resp 16 | Ht 70.0 in | Wt 224.2 lb

## 2019-01-09 DIAGNOSIS — M7021 Olecranon bursitis, right elbow: Secondary | ICD-10-CM | POA: Insufficient documentation

## 2019-01-09 MED ORDER — MELOXICAM 15 MG PO TABS: 15.0000 mg | ORAL_TABLET | Freq: Every day | ORAL | 0 refills | Status: DC

## 2019-01-09 NOTE — Progress Notes (Signed)
Subjective:  Patient ID: Matthew Franco, male    DOB: [birth year]  Age: 48 y.o. MRN: [id number]  CC: Elbow Pain   HPI Matthew Franco presents for concerns about his right elbow.  He complains of a 36-month history of pain over the tip of his right elbow.  He denies any specific trauma or injury but he says the symptoms started after he was throwing a football with his son.  There has been no swelling or decreased range of motion.  He has not taken anything to control his symptoms.  None of his other joints are bothering him.  Outpatient Medications Prior to Visit  Medication Sig Dispense Refill  . albuterol (PROVENTIL HFA;VENTOLIN HFA) 108 (90 Base) MCG/ACT inhaler Inhale 2 puffs into the lungs every 4 (four) hours as needed for wheezing or shortness of breath (cough, shortness of breath or wheezing.). 1 Inhaler 1  . azithromycin (ZITHROMAX) 250 MG tablet Take 2 tabs PO x 1 dose, then 1 tab PO QD x 4 days 6 tablet 0  . meloxicam (MOBIC) 15 MG tablet Take 1 tablet (15 mg total) by mouth daily. (Patient not taking: Reported on 04/19/2017) 90 tablet 1  . predniSONE (DELTASONE) 20 MG tablet Take 1 tablet (20 mg total) by mouth daily with breakfast. 5 tablet 0   No facility-administered medications prior to visit.     ROS Review of Systems  Constitutional: Negative.   Musculoskeletal: Positive for arthralgias.    Objective:  BP 124/88 (BP Location: Left Arm, Patient Position: Sitting, Cuff Size: Large)   Pulse 100   Temp 98.5 F (36.9 C) (Oral)   Resp 16   Ht 5\' 10"  (1.778 m)   Wt 224 lb 4 oz (101.7 kg)   SpO2 96%   BMI 32.18 kg/m   BP Readings from Last 3 Encounters:  01/09/19 124/88  04/19/17 (!) 146/88  10/14/15 126/70    Wt Readings from Last 3 Encounters:  01/09/19 224 lb 4 oz (101.7 kg)  04/19/17 208 lb (94.3 kg)  10/14/15 224 lb 12 oz (101.9 kg)    Physical Exam Musculoskeletal:     Right elbow: He exhibits normal range of motion, no swelling, no effusion and no  deformity. Tenderness found. Olecranon process tenderness noted. No medial epicondyle and no lateral epicondyle tenderness noted.     Lab Results  Component Value Date   WBC 7.1 08/14/2015   HGB 15.6 08/14/2015   HCT 46.8 08/14/2015   PLT 242.0 08/14/2015   GLUCOSE 91 08/14/2015   CHOL 247 (H) 08/14/2015   TRIG 216.0 (H) 08/14/2015   HDL 35.10 (L) 08/14/2015   LDLDIRECT 169.0 08/14/2015   ALT 17 08/14/2015   AST 21 08/14/2015   NA 138 08/14/2015   K 4.0 08/14/2015   CL 102 08/14/2015   CREATININE 1.17 08/14/2015   BUN 10 08/14/2015   CO2 28 08/14/2015   TSH 0.61 08/14/2015   PSA 1.28 08/14/2015   HGBA1C 6.0 08/14/2015    Dg Foot Complete Right  Result Date: 10/15/2015 CLINICAL DATA:  Medial foot pain.  No known injury. EXAM: RIGHT FOOT COMPLETE - 3+ VIEW COMPARISON:  06/06/2010. FINDINGS: No acute bony or joint abnormality identified. No evidence of fracture or dislocation. IMPRESSION: No acute abnormality P Electronically Signed   By: Maisie Fus  Register   On: 10/15/2015 08:08    Assessment & Plan:   Zakarie was seen today for elbow pain.  Diagnoses and all orders for this visit:  Olecranon  bursitis of right elbow- He will rest and ice the right elbow.  I have asked him to start taking an anti-inflammatory.  He was given patient education material as well. -     meloxicam (MOBIC) 15 MG tablet; Take 1 tablet (15 mg total) by mouth daily.   I have discontinued Ethelene Browns L. Wiegert's meloxicam, predniSONE, albuterol, and azithromycin. I am also having him start on meloxicam.  Meds ordered this encounter  Medications  . meloxicam (MOBIC) 15 MG tablet    Sig: Take 1 tablet (15 mg total) by mouth daily.    Dispense:  90 tablet    Refill:  0     Follow-up: Return in about 3 weeks (around 01/30/2019).  Sanda Linger, MD

## 2019-01-09 NOTE — Patient Instructions (Signed)
Elbow Bursitis  Bursitis is swelling and pain at the tip of your elbow. This happens when fluid builds up in a sac under your skin (bursa). This may also be called olecranon bursitis.  Follow these instructions at home:  Medicines   Take over-the-counter and prescription medicines only as told by your doctor.   If you were prescribed an antibiotic, take it exactly as told by your doctor. Do not stop taking it even if you start to feel better.  Managing pain, stiffness, and swelling     If told, put ice on your elbow:  ? Put ice in a plastic bag.  ? Place a towel between your skin and the bag.  ? Leave the ice on for 20 minutes, 2-3 times a day.   If your bursitis is caused by an injury, follow instructions from your doctor about:  ? Resting your elbow.  ? Wearing a bandage.   Wear elbow pads or elbow wraps as needed. These help cushion your elbow.  General instructions   Avoid any activities that cause elbow pain. Ask your doctor what activities are safe for you.   Keep all follow-up visits as told by your doctor. This is important.  Contact a doctor if you have:   A fever.   Problems that do not get better with treatment.   Pain or swelling that:  ? Gets worse.  ? Goes away and then comes back.   Pus draining from your elbow.  Get help right away if you have:   Trouble moving your arm, hand, or fingers.  Summary   Bursitis is swelling and pain at the tip of the elbow.   You may need to take medicine or put ice on your elbow.   Contact your doctor if your problems do not get better with treatment.  This information is not intended to replace advice given to you by your health care provider. Make sure you discuss any questions you have with your health care provider.  Document Released: 04/01/2010 Document Revised: 09/21/2017 Document Reviewed: 09/21/2017  Elsevier Interactive Patient Education  2019 [employer]

## 2019-08-24 ENCOUNTER — Other Ambulatory Visit (INDEPENDENT_AMBULATORY_CARE_PROVIDER_SITE_OTHER): Payer: 59

## 2019-08-24 ENCOUNTER — Encounter: Payer: Self-pay | Admitting: Internal Medicine

## 2019-08-24 ENCOUNTER — Ambulatory Visit (INDEPENDENT_AMBULATORY_CARE_PROVIDER_SITE_OTHER)
Admission: RE | Admit: 2019-08-24 | Discharge: 2019-08-24 | Disposition: A | Discharge status: Home / Self Care | Payer: 59 | Source: Ambulatory Visit | Attending: Internal Medicine | Admitting: Internal Medicine

## 2019-08-24 ENCOUNTER — Other Ambulatory Visit: Payer: Self-pay

## 2019-08-24 ENCOUNTER — Ambulatory Visit (INDEPENDENT_AMBULATORY_CARE_PROVIDER_SITE_OTHER): Payer: 59 | Admitting: Internal Medicine

## 2019-08-24 VITALS — BP 130/90 | HR 78 | Temp 98.1°F | Ht 70.0 in | Wt 225.0 lb

## 2019-08-24 DIAGNOSIS — I10 Essential (primary) hypertension: Secondary | ICD-10-CM | POA: Diagnosis not present

## 2019-08-24 DIAGNOSIS — R7303 Prediabetes: Secondary | ICD-10-CM

## 2019-08-24 DIAGNOSIS — G8929 Other chronic pain: Secondary | ICD-10-CM

## 2019-08-24 DIAGNOSIS — Z0000 Encounter for general adult medical examination without abnormal findings: Secondary | ICD-10-CM

## 2019-08-24 DIAGNOSIS — M79642 Pain in left hand: Secondary | ICD-10-CM

## 2019-08-24 DIAGNOSIS — E785 Hyperlipidemia, unspecified: Secondary | ICD-10-CM | POA: Diagnosis not present

## 2019-08-24 DIAGNOSIS — M19042 Primary osteoarthritis, left hand: Secondary | ICD-10-CM

## 2019-08-24 LAB — HEPATIC FUNCTION PANEL
ALT: 17 U/L (ref 0–53)
AST: 34 U/L (ref 0–37)
Albumin: 4.5 g/dL (ref 3.5–5.2)
Alkaline Phosphatase: 82 U/L (ref 39–117)
Bilirubin, Direct: 0.1 mg/dL (ref 0.0–0.3)
Total Bilirubin: 0.7 mg/dL (ref 0.2–1.2)
Total Protein: 6.9 g/dL (ref 6.0–8.3)

## 2019-08-24 LAB — LIPID PANEL
Cholesterol: 235 mg/dL — ABNORMAL HIGH (ref 0–200)
HDL: 37.3 mg/dL — ABNORMAL LOW (ref >39.00)
LDL Cholesterol: 179 mg/dL — ABNORMAL HIGH (ref 0–99)
NonHDL: 198.15
Total CHOL/HDL Ratio: 6
Triglycerides: 97 mg/dL (ref 0.0–149.0)
VLDL: 19.4 mg/dL (ref 0.0–40.0)

## 2019-08-24 LAB — BASIC METABOLIC PANEL
BUN: 11 mg/dL (ref 6–23)
CO2: 27 mEq/L (ref 19–32)
Calcium: 9.4 mg/dL (ref 8.4–10.5)
Chloride: 103 mEq/L (ref 96–112)
Creatinine, Ser: 1.14 mg/dL (ref 0.40–1.50)
GFR: 82.49 mL/min (ref >60.00)
Glucose, Bld: 86 mg/dL (ref 70–99)
Potassium: 3.9 mEq/L (ref 3.5–5.1)
Sodium: 137 mEq/L (ref 135–145)

## 2019-08-24 LAB — CBC WITH DIFFERENTIAL/PLATELET
Basophils Absolute: 0.1 10*3/uL (ref 0.0–0.1)
Basophils Relative: 0.9 % (ref 0.0–3.0)
Eosinophils Absolute: 0.1 10*3/uL (ref 0.0–0.7)
Eosinophils Relative: 2.3 % (ref 0.0–5.0)
HCT: 45.5 % (ref 39.0–52.0)
Hemoglobin: 15.1 g/dL (ref 13.0–17.0)
Lymphocytes Relative: 47.8 % — ABNORMAL HIGH (ref 12.0–46.0)
Lymphs Abs: 2.9 10*3/uL (ref 0.7–4.0)
MCHC: 33.2 g/dL (ref 30.0–36.0)
MCV: 91.7 fl (ref 78.0–100.0)
Monocytes Absolute: 0.5 10*3/uL (ref 0.1–1.0)
Monocytes Relative: 8.9 % (ref 3.0–12.0)
Neutro Abs: 2.4 10*3/uL (ref 1.4–7.7)
Neutrophils Relative %: 40.1 % — ABNORMAL LOW (ref 43.0–77.0)
Platelets: 241 10*3/uL (ref 150.0–400.0)
RBC: 4.96 Mil/uL (ref 4.22–5.81)
RDW: 13.9 % (ref 11.5–15.5)
WBC: 6.1 10*3/uL (ref 4.0–10.5)

## 2019-08-24 LAB — HEMOGLOBIN A1C: Hgb A1c MFr Bld: 6.1 % (ref 4.6–6.5)

## 2019-08-24 NOTE — Progress Notes (Signed)
Subjective:  Patient ID: Matthew Franco, male    DOB: 10-24-1971  Age: 48 y.o. MRN: [id number]  CC: Annual Exam and Hypertension   HPI Matthew Franco presents for a CPX.  He complains of a 2-year history of worsening left hand pain at the base of the thumb.  He denies any history of trauma or injury.  He says his joints do not get swollen.  None of his other joints bother him.  He has tried to control this with meloxicam but has not gotten much symptom relief.  History Matthew Franco has a past medical history of Allergy, Asthma, GERD (gastroesophageal reflux disease), and Hyperlipidemia.   He has a past surgical history that includes Knee surgery.   His family history includes Alcohol abuse in an other family member; Cancer in an other family member.He reports that he quit smoking about 8 years ago. He has never used smokeless tobacco. He reports current drug use. Drug: Marijuana. He reports that he does not drink alcohol.  Outpatient Medications Prior to Visit  Medication Sig Dispense Refill  . meloxicam (MOBIC) 15 MG tablet Take 1 tablet (15 mg total) by mouth daily. 90 tablet 0   No facility-administered medications prior to visit.     ROS Review of Systems  Constitutional: Negative.  Negative for diaphoresis and unexpected weight change.  HENT: Negative.   Eyes: Negative.  Negative for visual disturbance.  Respiratory: Negative for cough, chest tightness, shortness of breath and wheezing.   Cardiovascular: Negative for chest pain, palpitations and leg swelling.  Gastrointestinal: Negative for abdominal pain, constipation, diarrhea, nausea and vomiting.  Genitourinary: Negative.  Negative for difficulty urinating, scrotal swelling, testicular pain and urgency.  Musculoskeletal: Positive for arthralgias. Negative for back pain, myalgias and neck pain.  Skin: Negative.  Negative for color change and pallor.  Neurological: Negative.  Negative for dizziness, weakness and  light-headedness.  Hematological: Negative for adenopathy. Does not bruise/bleed easily.  Psychiatric/Behavioral: Negative.     Objective:  BP 130/90 (BP Location: Left Arm, Patient Position: Sitting, Cuff Size: Large)   Pulse 78   Temp 98.1 F (36.7 C) (Oral)   Ht 5\' 10"  (1.778 m)   Wt 225 lb (102.1 kg)   SpO2 96%   BMI 32.28 kg/m   Physical Exam Vitals signs reviewed.  Constitutional:      Appearance: Normal appearance.  HENT:     Nose: Nose normal.     Mouth/Throat:     Mouth: Mucous membranes are moist.  Eyes:     General: No scleral icterus.    Conjunctiva/sclera: Conjunctivae normal.  Neck:     Musculoskeletal: Neck supple. No muscular tenderness.  Cardiovascular:     Rate and Rhythm: Normal rate and regular rhythm.     Heart sounds: No murmur.  Pulmonary:     Effort: Pulmonary effort is normal.     Breath sounds: No stridor. No wheezing, rhonchi or rales.  Abdominal:     General: Abdomen is flat. Bowel sounds are normal. There is no distension.     Palpations: There is no hepatomegaly or splenomegaly.     Tenderness: There is no abdominal tenderness.     Hernia: There is no hernia in the left inguinal area or right inguinal area.  Genitourinary:    Pubic Area: No rash.      Penis: Normal. No discharge, swelling or lesions.      Scrotum/Testes: Normal.        Right: Mass not present.  Left: Mass not present.     Epididymis:     Right: Normal.     Left: Normal.     Prostate: Enlarged (1+ smooth symm BPH). Not tender and no nodules present.     Rectum: Normal. Guaiac result negative. No mass, tenderness, anal fissure, external hemorrhoid or internal hemorrhoid. Normal anal tone.  Musculoskeletal: Normal range of motion.     Left hand: He exhibits deformity. He exhibits normal range of motion, no tenderness and no bony tenderness. Normal sensation noted. Normal strength noted.     Right lower leg: No edema.     Left lower leg: No edema.     Comments:  There are degenerative changes noted at the base of the thumb.  Lymphadenopathy:     Cervical: No cervical adenopathy.     Lower Body: No right inguinal adenopathy. No left inguinal adenopathy.  Skin:    General: Skin is warm and dry.     Coloration: Skin is not pale.     Findings: No rash.  Neurological:     General: No focal deficit present.     Mental Status: He is alert and oriented to person, place, and time. Mental status is at baseline.     Lab Results  Component Value Date   WBC 6.1 08/24/2019   HGB 15.1 08/24/2019   HCT 45.5 08/24/2019   PLT 241.0 08/24/2019   GLUCOSE 86 08/24/2019   CHOL 235 (H) 08/24/2019   TRIG 97.0 08/24/2019   HDL 37.30 (L) 08/24/2019   LDLDIRECT 169.0 08/14/2015   LDLCALC 179 (H) 08/24/2019   ALT 17 08/24/2019   AST 34 08/24/2019   NA 137 08/24/2019   K 3.9 08/24/2019   CL 103 08/24/2019   CREATININE 1.14 08/24/2019   BUN 11 08/24/2019   CO2 27 08/24/2019   TSH 1.52 08/24/2019   PSA 1.71 08/24/2019   HGBA1C 6.1 08/24/2019    Dg Hand Complete Left  Result Date: 08/24/2019 CLINICAL DATA:  Chronic base of thumb pain. EXAM: LEFT HAND - COMPLETE 3+ VIEW COMPARISON:  None FINDINGS: Joint space narrowing and marginal spur formation identified at the basilar joint. No acute fracture or dislocation identified. No acute fracture or dislocation. No radio-opaque foreign bodies. IMPRESSION: 1. Moderate degenerative change at the basilar joint. Electronically Signed   By: Kerby Moors M.D.   On: 08/24/2019 17:17    Assessment & Plan:   Sipriano was seen today for annual exam and hypertension.  Diagnoses and all orders for this visit:  Routine general medical examination at a health care facility- Exam completed, labs reviewed, he refused a flu vaccine, patient education material was given. -     Lipid panel; Future -     HIV Antibody (routine testing w rflx); Future -     PSA; Future  Prediabetes- His A1c is at 6.1%.  He is prediabetic.   Medical therapy is not indicated. -     CBC with Differential/Platelet; Future -     Basic metabolic panel; Future -     Hemoglobin A1c; Future -     Hepatic function panel; Future  Chronic hand pain, left- Based on his symptoms, exam, and plain x-rays I think he has osteoarthritis.  He is not willing to consider surgical intervention.  I have asked him to upgrade to a more potent anti-inflammatory. -     DG Hand Complete Left; Future  Essential hypertension- He has stage I hypertension.  Labs are negative for secondary  causes or endorgan damage.  He is not willing to take an antihypertensive at this time.  He will work on his lifestyle modifications and will return in approximately 3 months for blood pressure recheck. -     TSH; Future -     Urinalysis, Routine w reflex microscopic; Future  Hyperlipidemia with target LDL less than 160- He does not have an elevated ASCVD risk score so I did not recommend a statin for CV risk reduction.  Localized osteoarthritis of left hand -     etodolac (LODINE XL) 400 MG 24 hr tablet; Take 1 tablet (400 mg total) by mouth daily.   I have discontinued Matthew Browns L. Anacker's meloxicam. I am also having him start on etodolac.  Meds ordered this encounter  Medications  . etodolac (LODINE XL) 400 MG 24 hr tablet    Sig: Take 1 tablet (400 mg total) by mouth daily.    Dispense:  90 tablet    Refill:  0     Follow-up: Return in about 3 months (around 11/24/2019).  Sanda Linger, MD

## 2019-08-24 NOTE — Patient Instructions (Signed)

## 2019-08-25 ENCOUNTER — Encounter: Payer: Self-pay | Admitting: Internal Medicine

## 2019-08-25 DIAGNOSIS — M19042 Primary osteoarthritis, left hand: Secondary | ICD-10-CM | POA: Insufficient documentation

## 2019-08-25 LAB — HIV ANTIBODY (ROUTINE TESTING W REFLEX): HIV 1&2 Ab, 4th Generation: NONREACTIVE

## 2019-08-25 LAB — URINALYSIS, ROUTINE W REFLEX MICROSCOPIC
Bilirubin Urine: NEGATIVE
Ketones, ur: NEGATIVE
Leukocytes,Ua: NEGATIVE
Nitrite: NEGATIVE
Specific Gravity, Urine: 1.025 (ref 1.000–1.030)
Total Protein, Urine: NEGATIVE
Urine Glucose: NEGATIVE
Urobilinogen, UA: 0.2 (ref 0.0–1.0)
pH: 6 (ref 5.0–8.0)

## 2019-08-25 LAB — TSH: TSH: 1.52 u[IU]/mL (ref 0.35–4.50)

## 2019-08-25 LAB — PSA: PSA: 1.71 ng/mL (ref 0.10–4.00)

## 2019-08-25 MED ORDER — ETODOLAC ER 400 MG PO TB24: 400.0000 mg | ORAL_TABLET | Freq: Every day | ORAL | 0 refills | Status: DC

## 2020-06-14 ENCOUNTER — Telehealth: Payer: Self-pay | Admitting: Internal Medicine

## 2020-06-14 NOTE — Telephone Encounter (Signed)
Scheduler called to schedule patient appointment, spouse states she will call back after she checks insurance benefits.    Copied from CRM [id number]. Topic: Appointment Scheduling - Scheduling Inquiry for Clinic >> Jun 13, 2020  5:05 PM Randol Kern wrote: Reason for CRM: Pt's wife called after 5 requesting to schedule CPE. Please advise  Abdalla Naramore [phone]

## 2020-09-03 ENCOUNTER — Ambulatory Visit: Payer: Self-pay

## 2020-11-05 ENCOUNTER — Encounter: Payer: Self-pay | Admitting: Internal Medicine

## 2020-11-05 ENCOUNTER — Telehealth: Payer: Self-pay | Admitting: Internal Medicine

## 2020-11-05 ENCOUNTER — Other Ambulatory Visit: Payer: Self-pay

## 2020-11-05 ENCOUNTER — Telehealth (INDEPENDENT_AMBULATORY_CARE_PROVIDER_SITE_OTHER): Payer: 59 | Admitting: Internal Medicine

## 2020-11-05 DIAGNOSIS — J209 Acute bronchitis, unspecified: Secondary | ICD-10-CM | POA: Diagnosis not present

## 2020-11-05 MED ORDER — ALBUTEROL SULFATE (2.5 MG/3ML) 0.083% IN NEBU: 2.5000 mg | INHALATION_SOLUTION | Freq: Four times a day (QID) | RESPIRATORY_TRACT | 0 refills | Status: DC | PRN

## 2020-11-05 MED ORDER — CEFDINIR 300 MG PO CAPS: 300.0000 mg | ORAL_CAPSULE | Freq: Two times a day (BID) | ORAL | 0 refills | Status: DC

## 2020-11-05 MED ORDER — ALBUTEROL SULFATE HFA 108 (90 BASE) MCG/ACT IN AERS: 2.0000 | INHALATION_SPRAY | Freq: Four times a day (QID) | RESPIRATORY_TRACT | 0 refills | Status: DC | PRN

## 2020-11-05 NOTE — Telephone Encounter (Signed)
Patients wife calling stating the patient has been having chest pain for about two weeks now, transferred to [facility] for further evaluation.

## 2020-11-05 NOTE — Telephone Encounter (Signed)
Either way - whatever he wants.

## 2020-11-05 NOTE — Telephone Encounter (Signed)
[facility] nurse called over and stated the patient has a bad cough and needs to be seen in 4 hours.  Appointment made for today for a MyChart with Dr.Burns.

## 2020-11-05 NOTE — Telephone Encounter (Signed)
Patient needs a new nebulizer, not just the attachments. They checked it after the call and realized that they didn't just need the attachments  [facility] [id number], [place] - [address] Phone:  [phone]  Fax:  [phone]     Please follow-up with the patient

## 2020-11-05 NOTE — Progress Notes (Signed)
Virtual Visit via telephone Note  I connected with Matthew Franco on 11/05/20 at 10:45 AM EST by telephone and verified that I am speaking with the correct person using two identifiers.   I discussed the limitations of evaluation and management by telemedicine and the availability of in person appointments. The patient expressed understanding and agreed to proceed.  Present for the visit:  Myself, Dr Cheryll Cockayne, [name].  The patient is currently at work and I am in the office.    No referring provider.    History of Present Illness: This is an acute visit for cough  He states his symptoms first started before Christmas.  He did take a COVID test at that time and it was negative.  He initially had body aches, headaches and fatigue.  Those symptoms improved, but he developed a productive cough of discolored mucus and wheezing that has persisted.  He states he has a history of asthma and typically he has asthma symptoms with cold symptoms.  His asthma has started to act up and he has been using his albuterol inhaler which does help.  He takes this about twice a day.  He has a nebulizer machine at home, but does not have any medicine for it.      Review of Systems  Constitutional: Negative for fever.  HENT: Negative for congestion, sinus pain and sore throat.   Respiratory: Positive for cough, sputum production and wheezing. Negative for shortness of breath.   Musculoskeletal: Negative for myalgias.  Neurological: Negative for headaches.      Social History   Socioeconomic History  . Marital status: Married    Spouse name: Not on file  . Number of children: Not on file  . Years of education: Not on file  . Highest education level: Not on file  Occupational History  . Not on file  Tobacco Use  . Smoking status: Former Smoker    Quit date: 07/25/2011    Years since quitting: 9.2  . Smokeless tobacco: Never Used  Substance and Sexual Activity  . Alcohol use: No  .  Drug use: Yes    Types: Marijuana  . Sexual activity: Yes  Other Topics Concern  . Not on file  Social History Narrative  . Not on file   Social Determinants of Health   Financial Resource Strain: Not on file  Food Insecurity: Not on file  Transportation Needs: Not on file  Physical Activity: Not on file  Stress: Not on file  Social Connections: Not on file     Observations/Objective:    Assessment and Plan:  Acute bronchitis with mild asthma exacerbation: Likely bacterial Mild asthma exacerbation Does not sound like he needs steroids at this time Continue albuterol via inhaler or neb treatment every 6 hours as needed He does not have any nebulizer tubing and this will be given to him Albuterol inhaler and neb solution sent to pharmacy Start Omnicef 300 mg twice daily x10 days over-the-counter cold medications for symptom relief Call if no improvement   Follow Up Instructions:    I discussed the assessment and treatment plan with the patient. The patient was provided an opportunity to ask questions and all were answered. The patient agreed with the plan and demonstrated an understanding of the instructions.   The patient was advised to call back or seek an in-person evaluation if the symptoms worsen or if the condition fails to improve as anticipated.  Time spent on telephone call:  8 minutes  Pincus Sanes, MD

## 2020-11-05 NOTE — Telephone Encounter (Signed)
Noted  

## 2020-11-06 NOTE — Telephone Encounter (Signed)
Patient's wife coming to pick up nebulizer today. Told her to ask for me when she gets here.

## 2020-12-25 ENCOUNTER — Ambulatory Visit (INDEPENDENT_AMBULATORY_CARE_PROVIDER_SITE_OTHER): Payer: 59 | Admitting: Internal Medicine

## 2020-12-25 ENCOUNTER — Encounter: Payer: Self-pay | Admitting: Internal Medicine

## 2020-12-25 ENCOUNTER — Other Ambulatory Visit: Payer: Self-pay

## 2020-12-25 ENCOUNTER — Other Ambulatory Visit (INDEPENDENT_AMBULATORY_CARE_PROVIDER_SITE_OTHER): Payer: 59

## 2020-12-25 VITALS — BP 124/84 | HR 86 | Temp 98.3°F | Ht 70.0 in | Wt 229.0 lb

## 2020-12-25 DIAGNOSIS — F528 Other sexual dysfunction not due to a substance or known physiological condition: Secondary | ICD-10-CM | POA: Diagnosis not present

## 2020-12-25 DIAGNOSIS — R7303 Prediabetes: Secondary | ICD-10-CM

## 2020-12-25 DIAGNOSIS — Z0000 Encounter for general adult medical examination without abnormal findings: Secondary | ICD-10-CM

## 2020-12-25 DIAGNOSIS — Z1211 Encounter for screening for malignant neoplasm of colon: Secondary | ICD-10-CM

## 2020-12-25 DIAGNOSIS — M19042 Primary osteoarthritis, left hand: Secondary | ICD-10-CM

## 2020-12-25 DIAGNOSIS — E785 Hyperlipidemia, unspecified: Secondary | ICD-10-CM | POA: Diagnosis not present

## 2020-12-25 DIAGNOSIS — I10 Essential (primary) hypertension: Secondary | ICD-10-CM

## 2020-12-25 DIAGNOSIS — R739 Hyperglycemia, unspecified: Secondary | ICD-10-CM

## 2020-12-25 LAB — CBC WITH DIFFERENTIAL/PLATELET
Basophils Absolute: 0.1 10*3/uL (ref 0.0–0.1)
Basophils Relative: 1.2 % (ref 0.0–3.0)
Eosinophils Absolute: 0.1 10*3/uL (ref 0.0–0.7)
Eosinophils Relative: 1.6 % (ref 0.0–5.0)
HCT: 46 % (ref 39.0–52.0)
Hemoglobin: 15.5 g/dL (ref 13.0–17.0)
Lymphocytes Relative: 35 % (ref 12.0–46.0)
Lymphs Abs: 2.4 10*3/uL (ref 0.7–4.0)
MCHC: 33.7 g/dL (ref 30.0–36.0)
MCV: 88.6 fl (ref 78.0–100.0)
Monocytes Absolute: 0.6 10*3/uL (ref 0.1–1.0)
Monocytes Relative: 9 % (ref 3.0–12.0)
Neutro Abs: 3.6 10*3/uL (ref 1.4–7.7)
Neutrophils Relative %: 53.2 % (ref 43.0–77.0)
Platelets: 247 10*3/uL (ref 150.0–400.0)
RBC: 5.19 Mil/uL (ref 4.22–5.81)
RDW: 13.7 % (ref 11.5–15.5)
WBC: 6.8 10*3/uL (ref 4.0–10.5)

## 2020-12-25 LAB — LIPID PANEL
Cholesterol: 199 mg/dL (ref 0–200)
HDL: 40.7 mg/dL (ref >39.00)
LDL Cholesterol: 130 mg/dL — ABNORMAL HIGH (ref 0–99)
NonHDL: 158.13
Total CHOL/HDL Ratio: 5
Triglycerides: 143 mg/dL (ref 0.0–149.0)
VLDL: 28.6 mg/dL (ref 0.0–40.0)

## 2020-12-25 LAB — HEPATIC FUNCTION PANEL
ALT: 18 U/L (ref 0–53)
AST: 26 U/L (ref 0–37)
Albumin: 4.3 g/dL (ref 3.5–5.2)
Alkaline Phosphatase: 78 U/L (ref 39–117)
Bilirubin, Direct: 0.1 mg/dL (ref 0.0–0.3)
Total Bilirubin: 0.6 mg/dL (ref 0.2–1.2)
Total Protein: 7.1 g/dL (ref 6.0–8.3)

## 2020-12-25 LAB — BASIC METABOLIC PANEL
BUN: 13 mg/dL (ref 6–23)
CO2: 29 mEq/L (ref 19–32)
Calcium: 9.6 mg/dL (ref 8.4–10.5)
Chloride: 102 mEq/L (ref 96–112)
Creatinine, Ser: 1.16 mg/dL (ref 0.40–1.50)
GFR: 73.34 mL/min (ref >60.00)
Glucose, Bld: 79 mg/dL (ref 70–99)
Potassium: 3.8 mEq/L (ref 3.5–5.1)
Sodium: 135 mEq/L (ref 135–145)

## 2020-12-25 LAB — HEMOGLOBIN A1C: Hgb A1c MFr Bld: 6.2 % (ref 4.6–6.5)

## 2020-12-25 MED ORDER — ETODOLAC ER 400 MG PO TB24: 400.0000 mg | ORAL_TABLET | Freq: Every day | ORAL | 1 refills | Status: DC

## 2020-12-25 MED ORDER — TADALAFIL 20 MG PO TABS: 20.0000 mg | ORAL_TABLET | Freq: Every day | ORAL | 5 refills | Status: AC | PRN

## 2020-12-25 NOTE — Progress Notes (Signed)
Subjective:  Patient ID: Matthew Franco, male    DOB: 1971/10/16  Age: 50 y.o. MRN: [id number]  CC: Annual Exam and Osteoarthritis  This visit occurred during the SARS-CoV-2 public health emergency.  Safety protocols were in place, including screening questions prior to the visit, additional usage of staff PPE, and extensive cleaning of exam room while observing appropriate contact time as indicated for disinfecting solutions.    HPI Matthew Franco presents for a CPX.  He complains of chronic pain in his left hand at the base of the thumb. He previously got symptom relief with etodolac but is no longer taking it. He has a history of hypertension and prediabetes. He is working on his lifestyle modifications and denies CP, DOE, palpitations, edema, fatigue, or polys. He complains of a several year history of inability to keep and maintain an erection.  History Matthew Franco has a past medical history of Allergy, Asthma, GERD (gastroesophageal reflux disease), and Hyperlipidemia.   He has a past surgical history that includes Knee surgery.   His family history includes Alcohol abuse in an other family member; Cancer in an other family member.He reports that he quit smoking about 9 years ago. He has never used smokeless tobacco. He reports current drug use. Drug: Marijuana. He reports that he does not drink alcohol.  Outpatient Medications Prior to Visit  Medication Sig Dispense Refill  . albuterol (PROVENTIL) (2.5 MG/3ML) 0.083% nebulizer solution Take 3 mLs (2.5 mg total) by nebulization every 6 (six) hours as needed for wheezing or shortness of breath. 150 mL 0  . albuterol (VENTOLIN HFA) 108 (90 Base) MCG/ACT inhaler Inhale 2 puffs into the lungs every 6 (six) hours as needed for wheezing or shortness of breath. 8 g 0  . cefdinir (OMNICEF) 300 MG capsule Take 1 capsule (300 mg total) by mouth 2 (two) times daily. 20 capsule 0  . etodolac (LODINE XL) 400 MG 24 hr tablet Take 1 tablet (400 mg  total) by mouth daily. 90 tablet 0   No facility-administered medications prior to visit.    ROS Review of Systems  Constitutional: Positive for unexpected weight change (wt gain). Negative for appetite change, chills, diaphoresis and fatigue.  HENT: Negative.   Eyes: Negative for visual disturbance.  Respiratory: Negative for apnea, cough, chest tightness, shortness of breath and wheezing.   Cardiovascular: Negative for chest pain, palpitations and leg swelling.  Gastrointestinal: Negative for abdominal pain, constipation, diarrhea, nausea and vomiting.  Endocrine: Negative.  Negative for polydipsia, polyphagia and polyuria.  Genitourinary: Negative.  Negative for difficulty urinating, dysuria, hematuria, scrotal swelling and testicular pain.       ++ED  Musculoskeletal: Positive for arthralgias. Negative for back pain, myalgias and neck pain.  Skin: Negative for color change, pallor and rash.  Neurological: Negative.  Negative for dizziness and weakness.  Hematological: Negative for adenopathy. Does not bruise/bleed easily.  Psychiatric/Behavioral: Negative.     Objective:  BP 124/84   Pulse 86   Temp 98.3 F (36.8 C) (Oral)   Ht 5\' 10"  (1.778 m)   Wt 229 lb (103.9 kg)   SpO2 97%   BMI 32.86 kg/m   Physical Exam Vitals reviewed.  Constitutional:      Appearance: He is obese.  HENT:     Nose: Nose normal.     Mouth/Throat:     Mouth: Mucous membranes are moist.  Eyes:     General: No scleral icterus.    Conjunctiva/sclera: Conjunctivae normal.  Cardiovascular:  Rate and Rhythm: Normal rate and regular rhythm.     Pulses: Normal pulses.     Heart sounds: No murmur heard.   Pulmonary:     Effort: Pulmonary effort is normal.     Breath sounds: No stridor. No wheezing, rhonchi or rales.  Abdominal:     General: Abdomen is protuberant. Bowel sounds are normal. There is no distension.     Palpations: Abdomen is soft. There is no hepatomegaly, splenomegaly or  mass.     Tenderness: There is no abdominal tenderness.     Hernia: No hernia is present. There is no hernia in the left inguinal area or right inguinal area.  Genitourinary:    Pubic Area: No rash.      Penis: Normal and circumcised. No discharge, swelling or lesions.      Testes: Normal.     Epididymis:     Right: Normal. No mass.     Left: Normal. No mass.     Prostate: Normal. Not enlarged, not tender and no nodules present.     Rectum: Normal. Guaiac result negative. No mass, tenderness, anal fissure, external hemorrhoid or internal hemorrhoid. Normal anal tone.  Musculoskeletal:        General: Normal range of motion.       Hands:     Cervical back: Neck supple.     Right lower leg: No edema.     Left lower leg: No edema.  Lymphadenopathy:     Cervical: No cervical adenopathy.     Lower Body: No right inguinal adenopathy. No left inguinal adenopathy.  Skin:    General: Skin is warm and dry.     Coloration: Skin is not pale.  Neurological:     General: No focal deficit present.     Mental Status: He is alert.  Psychiatric:        Mood and Affect: Mood normal.        Behavior: Behavior normal.      Lab Results  Component Value Date   WBC 6.8 12/25/2020   HGB 15.5 12/25/2020   HCT 46.0 12/25/2020   PLT 247.0 12/25/2020   GLUCOSE 79 12/25/2020   CHOL 199 12/25/2020   TRIG 143.0 12/25/2020   HDL 40.70 12/25/2020   LDLDIRECT 169.0 08/14/2015   LDLCALC 130 (H) 12/25/2020   ALT 18 12/25/2020   AST 26 12/25/2020   NA 135 12/25/2020   K 3.8 12/25/2020   CL 102 12/25/2020   CREATININE 1.16 12/25/2020   BUN 13 12/25/2020   CO2 29 12/25/2020   TSH 0.77 12/25/2020   PSA 2.85 12/25/2020   HGBA1C 6.2 12/25/2020    Assessment & Plan:   Matthew Franco was seen today for annual exam and osteoarthritis.  Diagnoses and all orders for this visit:  Essential hypertension- His BP is well controlled. -     CBC with Differential/Platelet; Future -     Basic metabolic panel;  Future  Localized osteoarthritis of left hand- Will restart the NSAID. -     etodolac (LODINE XL) 400 MG 24 hr tablet; Take 1 tablet (400 mg total) by mouth daily. -     Basic metabolic panel; Future  Hyperglycemia  ERECTILE DYSFUNCTION- The work up for secondary causes is negative. Will try a PDE-5 inh. -     Testosterone Total,Free,Bio, Males; Future -     tadalafil (CIALIS) 20 MG tablet; Take 1 tablet (20 mg total) by mouth daily as needed for erectile dysfunction.  Hyperlipidemia  with target LDL less than 160- He has a low ASCVD risk score. Statin therapy is not indicated. -     Hepatic function panel; Future -     TSH; Future  Routine general medical examination at a health care facility- Exam completed, labs reviewed, vaccines reviewed, cancer screenings addressed, pt ed material was given. -     Lipid panel; Future -     PSA; Future  Prediabetes- His A1C is at 6.2%. medical therapy is not indicated. -     Basic metabolic panel; Future -     Hemoglobin A1c; Future  Screen for colon cancer  Colon cancer screening -     Cologuard   I have discontinued Matthew Franco's cefdinir. I am also having him start on tadalafil. Additionally, I am having him maintain his albuterol, albuterol, and etodolac.  Meds ordered this encounter  Medications  . etodolac (LODINE XL) 400 MG 24 hr tablet    Sig: Take 1 tablet (400 mg total) by mouth daily.    Dispense:  90 tablet    Refill:  1  . tadalafil (CIALIS) 20 MG tablet    Sig: Take 1 tablet (20 mg total) by mouth daily as needed for erectile dysfunction.    Dispense:  6 tablet    Refill:  5     Follow-up: Return in about 6 months (around 06/27/2021).  Sanda Linger, MD

## 2020-12-25 NOTE — Patient Instructions (Signed)

## 2020-12-26 ENCOUNTER — Encounter: Payer: Self-pay | Admitting: Internal Medicine

## 2020-12-26 LAB — TESTOSTERONE TOTAL,FREE,BIO, MALES
Albumin: 4.3 g/dL (ref 3.6–5.1)
Sex Hormone Binding: 35 nmol/L (ref 10–50)
Testosterone, Bioavailable: 91.1 ng/dL — ABNORMAL LOW (ref >=110.0)
Testosterone, Free: 46.2 pg/mL (ref 46.0–224.0)
Testosterone: 370 ng/dL (ref 250–827)

## 2020-12-26 LAB — PSA: PSA: 2.85 ng/mL (ref 0.10–4.00)

## 2020-12-26 LAB — TSH: TSH: 0.77 u[IU]/mL (ref 0.35–4.50)

## 2021-01-27 ENCOUNTER — Emergency Department (HOSPITAL_COMMUNITY): Payer: 59

## 2021-01-27 ENCOUNTER — Inpatient Hospital Stay (HOSPITAL_COMMUNITY)
Admission: EM | Admit: 2021-01-27 | Discharge: 2021-02-03 | DRG: 481 | Disposition: A | Discharge status: Home Health | Payer: 59 | Attending: Orthopedic Surgery | Admitting: Orthopedic Surgery

## 2021-01-27 DIAGNOSIS — R030 Elevated blood-pressure reading, without diagnosis of hypertension: Secondary | ICD-10-CM | POA: Diagnosis present

## 2021-01-27 DIAGNOSIS — S52371A Galeazzi's fracture of right radius, initial encounter for closed fracture: Secondary | ICD-10-CM | POA: Diagnosis present

## 2021-01-27 DIAGNOSIS — T1490XA Injury, unspecified, initial encounter: Secondary | ICD-10-CM

## 2021-01-27 DIAGNOSIS — S41111A Laceration without foreign body of right upper arm, initial encounter: Secondary | ICD-10-CM | POA: Diagnosis present

## 2021-01-27 DIAGNOSIS — S82201A Unspecified fracture of shaft of right tibia, initial encounter for closed fracture: Secondary | ICD-10-CM | POA: Diagnosis present

## 2021-01-27 DIAGNOSIS — S7221XA Displaced subtrochanteric fracture of right femur, initial encounter for closed fracture: Secondary | ICD-10-CM | POA: Diagnosis not present

## 2021-01-27 DIAGNOSIS — Z20822 Contact with and (suspected) exposure to covid-19: Secondary | ICD-10-CM | POA: Diagnosis present

## 2021-01-27 DIAGNOSIS — S0181XA Laceration without foreign body of other part of head, initial encounter: Secondary | ICD-10-CM | POA: Diagnosis present

## 2021-01-27 DIAGNOSIS — J45909 Unspecified asthma, uncomplicated: Secondary | ICD-10-CM | POA: Diagnosis present

## 2021-01-27 DIAGNOSIS — M1990 Unspecified osteoarthritis, unspecified site: Secondary | ICD-10-CM | POA: Diagnosis present

## 2021-01-27 DIAGNOSIS — R000 Tachycardia, unspecified: Secondary | ICD-10-CM | POA: Diagnosis present

## 2021-01-27 DIAGNOSIS — Z79899 Other long term (current) drug therapy: Secondary | ICD-10-CM

## 2021-01-27 DIAGNOSIS — R7303 Prediabetes: Secondary | ICD-10-CM | POA: Diagnosis present

## 2021-01-27 DIAGNOSIS — Z8616 Personal history of COVID-19: Secondary | ICD-10-CM

## 2021-01-27 DIAGNOSIS — F419 Anxiety disorder, unspecified: Secondary | ICD-10-CM | POA: Diagnosis present

## 2021-01-27 DIAGNOSIS — Y92410 Unspecified street and highway as the place of occurrence of the external cause: Secondary | ICD-10-CM

## 2021-01-27 DIAGNOSIS — S72351A Displaced comminuted fracture of shaft of right femur, initial encounter for closed fracture: Secondary | ICD-10-CM

## 2021-01-27 HISTORY — DX: Unspecified osteoarthritis, unspecified site: M19.90

## 2021-01-27 HISTORY — DX: Unspecified asthma, uncomplicated: J45.909

## 2021-01-27 LAB — URINALYSIS, ROUTINE W REFLEX MICROSCOPIC
Bacteria, UA: NONE SEEN
Bilirubin Urine: NEGATIVE
Glucose, UA: NEGATIVE mg/dL
Ketones, ur: NEGATIVE mg/dL
Leukocytes,Ua: NEGATIVE
Nitrite: NEGATIVE
Protein, ur: NEGATIVE mg/dL
Specific Gravity, Urine: 1.039 — ABNORMAL HIGH (ref 1.005–1.030)
pH: 5 (ref 5.0–8.0)

## 2021-01-27 LAB — COMPREHENSIVE METABOLIC PANEL
ALT: 24 U/L (ref 0–44)
AST: 38 U/L (ref 15–41)
Albumin: 3.8 g/dL (ref 3.5–5.0)
Alkaline Phosphatase: 75 U/L (ref 38–126)
Anion gap: 10 (ref 5–15)
BUN: 8 mg/dL (ref 6–20)
CO2: 22 mmol/L (ref 22–32)
Calcium: 9.3 mg/dL (ref 8.9–10.3)
Chloride: 105 mmol/L (ref 98–111)
Creatinine, Ser: 1.21 mg/dL (ref 0.61–1.24)
GFR, Estimated: >60 mL/min (ref >60)
Glucose, Bld: 164 mg/dL — ABNORMAL HIGH (ref 70–99)
Potassium: 3.8 mmol/L (ref 3.5–5.1)
Sodium: 137 mmol/L (ref 135–145)
Total Bilirubin: 0.7 mg/dL (ref 0.3–1.2)
Total Protein: 7.1 g/dL (ref 6.5–8.1)

## 2021-01-27 LAB — CBC
HCT: 47 % (ref 39.0–52.0)
Hemoglobin: 15.5 g/dL (ref 13.0–17.0)
MCH: 30 pg (ref 26.0–34.0)
MCHC: 33 g/dL (ref 30.0–36.0)
MCV: 90.9 fL (ref 80.0–100.0)
Platelets: 242 10*3/uL (ref 150–400)
RBC: 5.17 MIL/uL (ref 4.22–5.81)
RDW: 13.3 % (ref 11.5–15.5)
WBC: 13.5 10*3/uL — ABNORMAL HIGH (ref 4.0–10.5)
nRBC: 0 % (ref 0.0–0.2)

## 2021-01-27 LAB — RESP PANEL BY RT-PCR (FLU A&B, COVID) ARPGX2
Influenza A by PCR: NEGATIVE
Influenza B by PCR: NEGATIVE
SARS Coronavirus 2 by RT PCR: NEGATIVE

## 2021-01-27 LAB — SAMPLE TO BLOOD BANK

## 2021-01-27 LAB — I-STAT CHEM 8, ED
BUN: 9 mg/dL (ref 6–20)
Calcium, Ion: 1.13 mmol/L — ABNORMAL LOW (ref 1.15–1.40)
Chloride: 104 mmol/L (ref 98–111)
Creatinine, Ser: 1.2 mg/dL (ref 0.61–1.24)
Glucose, Bld: 158 mg/dL — ABNORMAL HIGH (ref 70–99)
HCT: 47 % (ref 39.0–52.0)
Hemoglobin: 16 g/dL (ref 13.0–17.0)
Potassium: 3.6 mmol/L (ref 3.5–5.1)
Sodium: 140 mmol/L (ref 135–145)
TCO2: 24 mmol/L (ref 22–32)

## 2021-01-27 LAB — ETHANOL: Alcohol, Ethyl (B): <10 mg/dL (ref <10)

## 2021-01-27 LAB — LACTIC ACID, PLASMA: Lactic Acid, Venous: 3 mmol/L (ref 0.5–1.9)

## 2021-01-27 MED ORDER — SODIUM CHLORIDE 0.9 % IV BOLUS
1000.0000 mL | Freq: Once | INTRAVENOUS | Status: AC
  Administered 2021-01-27: 1000 mL via INTRAVENOUS

## 2021-01-27 MED ORDER — HYDROMORPHONE HCL 1 MG/ML IJ SOLN
1.0000 mg | Freq: Once | INTRAMUSCULAR | Status: AC
  Administered 2021-01-27: 1 mg via INTRAVENOUS

## 2021-01-27 MED ORDER — IOHEXOL 300 MG/ML  SOLN
100.0000 mL | Freq: Once | INTRAMUSCULAR | Status: AC | PRN
  Administered 2021-01-27: 100 mL via INTRAVENOUS

## 2021-01-27 MED ORDER — LORAZEPAM 2 MG/ML IJ SOLN
INTRAMUSCULAR | Status: AC
  Administered 2021-01-27: 1 mg via INTRAVENOUS
  Filled 2021-01-27: qty 1

## 2021-01-27 MED ORDER — LORAZEPAM 2 MG/ML IJ SOLN: 1.0000 mg | Freq: Once | INTRAMUSCULAR | Status: AC

## 2021-01-27 MED ORDER — LIDOCAINE-EPINEPHRINE 1 %-1:100000 IJ SOLN
30.0000 mL | Freq: Once | INTRAMUSCULAR | Status: AC
  Administered 2021-01-27: 30 mL
  Filled 2021-01-27: qty 1

## 2021-01-27 MED ORDER — HYDROMORPHONE HCL 1 MG/ML IJ SOLN
INTRAMUSCULAR | Status: AC
  Filled 2021-01-27: qty 1

## 2021-01-27 MED ORDER — SODIUM CHLORIDE 0.9 % IV SOLN: Freq: Once | INTRAVENOUS | Status: AC

## 2021-01-27 MED ORDER — CEFAZOLIN SODIUM-DEXTROSE 2-4 GM/100ML-% IV SOLN
2.0000 g | Freq: Once | INTRAVENOUS | Status: AC
  Administered 2021-01-27: 2 g via INTRAVENOUS
  Filled 2021-01-27: qty 100
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
## 2021-01-27 NOTE — ED Notes (Signed)
Patient transported to CT 

## 2021-01-27 NOTE — Progress Notes (Signed)
Orthopedic Tech Progress Note Patient Details:  Matthew Franco 1971/06/16 [id number]  Musculoskeletal Traction Type of Traction: Bucks Skin Traction Traction Location: rle Traction Weight: 5 lbs   Post Interventions Patient Tolerated: Well Instructions Provided: Care of device,Adjustment of device   [name] 01/27/2021, 9:18 PM
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
## 2021-01-27 NOTE — Progress Notes (Signed)
Orthopedic Tech Progress Note Patient Details:  Matthew Franco [birth year] [id number]  Ortho Devices Type of Ortho Device: Sugartong splint Ortho Device/Splint Location: rue Ortho Device/Splint Interventions: Ordered,Application,Adjustment   Post Interventions Patient Tolerated: Well Instructions Provided: Care of device,Adjustment of device   [name] 01/27/2021, 11:47 PM
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
## 2021-01-28 ENCOUNTER — Observation Stay (HOSPITAL_COMMUNITY): Payer: 59 | Admitting: Anesthesiology

## 2021-01-28 ENCOUNTER — Observation Stay (HOSPITAL_COMMUNITY): Payer: 59

## 2021-01-28 ENCOUNTER — Encounter (HOSPITAL_COMMUNITY)
Admission: EM | Disposition: A | Discharge status: Home Health | Payer: Self-pay | Source: Home / Self Care | Attending: Orthopedic Surgery

## 2021-01-28 ENCOUNTER — Encounter (HOSPITAL_COMMUNITY): Payer: Self-pay | Admitting: Orthopedic Surgery

## 2021-01-28 ENCOUNTER — Other Ambulatory Visit: Payer: Self-pay

## 2021-01-28 DIAGNOSIS — S72351A Displaced comminuted fracture of shaft of right femur, initial encounter for closed fracture: Secondary | ICD-10-CM | POA: Diagnosis not present

## 2021-01-28 HISTORY — PX: FEMUR IM NAIL: SHX1597

## 2021-01-28 LAB — CBC
HCT: 39.5 % (ref 39.0–52.0)
Hemoglobin: 13.3 g/dL (ref 13.0–17.0)
MCH: 30.2 pg (ref 26.0–34.0)
MCHC: 33.7 g/dL (ref 30.0–36.0)
MCV: 89.8 fL (ref 80.0–100.0)
Platelets: 223 10*3/uL (ref 150–400)
RBC: 4.4 MIL/uL (ref 4.22–5.81)
RDW: 13.8 % (ref 11.5–15.5)
WBC: 11.5 10*3/uL — ABNORMAL HIGH (ref 4.0–10.5)
nRBC: 0 % (ref 0.0–0.2)

## 2021-01-28 LAB — CREATININE, SERUM
Creatinine, Ser: 1.37 mg/dL — ABNORMAL HIGH (ref 0.61–1.24)
GFR, Estimated: >60 mL/min (ref >60)

## 2021-01-28 LAB — PROTIME-INR
INR: 1.1 (ref 0.8–1.2)
Prothrombin Time: 13.7 seconds (ref 11.4–15.2)

## 2021-01-28 LAB — SURGICAL PCR SCREEN
MRSA, PCR: NEGATIVE
Staphylococcus aureus: NEGATIVE

## 2021-01-28 SURGERY — INSERTION, INTRAMEDULLARY ROD, FEMUR
Anesthesia: Regional | Site: Leg Upper | Laterality: Right

## 2021-01-28 MED ORDER — CHLORHEXIDINE GLUCONATE 0.12 % MT SOLN
OROMUCOSAL | Status: AC
  Administered 2021-01-28: 15 mL via OROMUCOSAL
  Filled 2021-01-28: qty 15

## 2021-01-28 MED ORDER — LACTATED RINGERS IV SOLN: INTRAVENOUS | Status: DC

## 2021-01-28 MED ORDER — ROPIVACAINE HCL 5 MG/ML IJ SOLN
INTRAMUSCULAR | Status: DC | PRN
  Administered 2021-01-28: 30 mL via PERINEURAL

## 2021-01-28 MED ORDER — MORPHINE SULFATE (PF) 2 MG/ML IV SOLN
0.5000 mg | INTRAVENOUS | Status: DC | PRN
  Administered 2021-01-28 – 2021-01-29 (×6): 1 mg via INTRAVENOUS
  Filled 2021-01-28 (×6): qty 1

## 2021-01-28 MED ORDER — METHOCARBAMOL 1000 MG/10ML IJ SOLN
500.0000 mg | Freq: Four times a day (QID) | INTRAVENOUS | Status: DC | PRN
  Filled 2021-01-28 (×3): qty 5

## 2021-01-28 MED ORDER — LIDOCAINE 2% (20 MG/ML) 5 ML SYRINGE
INTRAMUSCULAR | Status: DC | PRN
  Administered 2021-01-28: 100 mg via INTRAVENOUS

## 2021-01-28 MED ORDER — ALBUTEROL SULFATE (2.5 MG/3ML) 0.083% IN NEBU: 3.0000 mL | INHALATION_SOLUTION | Freq: Four times a day (QID) | RESPIRATORY_TRACT | Status: DC | PRN

## 2021-01-28 MED ORDER — MIDAZOLAM HCL 2 MG/2ML IJ SOLN
INTRAMUSCULAR | Status: AC
  Administered 2021-01-28: 2 mg via INTRAVENOUS
  Filled 2021-01-28: qty 2

## 2021-01-28 MED ORDER — CHLORHEXIDINE GLUCONATE 0.12 % MT SOLN: 15.0000 mL | Freq: Once | OROMUCOSAL | Status: AC

## 2021-01-28 MED ORDER — POVIDONE-IODINE 10 % EX SWAB: 2.0000 "application " | Freq: Once | CUTANEOUS | Status: DC

## 2021-01-28 MED ORDER — OXYCODONE HCL 5 MG PO TABS
5.0000 mg | ORAL_TABLET | Freq: Once | ORAL | Status: AC | PRN
Start: 2021-01-28 — End: 2021-01-28
  Administered 2021-01-28: 5 mg via ORAL

## 2021-01-28 MED ORDER — DOCUSATE SODIUM 100 MG PO CAPS
100.0000 mg | ORAL_CAPSULE | Freq: Two times a day (BID) | ORAL | Status: DC
  Administered 2021-01-29 – 2021-02-03 (×11): 100 mg via ORAL
  Filled 2021-01-28 (×12): qty 1

## 2021-01-28 MED ORDER — ENOXAPARIN SODIUM 40 MG/0.4ML ~~LOC~~ SOLN
40.0000 mg | SUBCUTANEOUS | Status: DC
  Administered 2021-01-29 – 2021-02-03 (×5): 40 mg via SUBCUTANEOUS
  Filled 2021-01-28 (×5): qty 0.4

## 2021-01-28 MED ORDER — POLYETHYLENE GLYCOL 3350 17 G PO PACK
17.0000 g | PACK | Freq: Every day | ORAL | Status: DC | PRN
  Administered 2021-02-01: 17 g via ORAL
  Filled 2021-01-28: qty 1

## 2021-01-28 MED ORDER — PROPOFOL 10 MG/ML IV BOLUS
INTRAVENOUS | Status: DC | PRN
  Administered 2021-01-28: 50 mg via INTRAVENOUS
  Administered 2021-01-28: 150 mg via INTRAVENOUS

## 2021-01-28 MED ORDER — MENTHOL 3 MG MT LOZG: 1.0000 | LOZENGE | OROMUCOSAL | Status: DC | PRN

## 2021-01-28 MED ORDER — OXYCODONE HCL 5 MG PO TABS
ORAL_TABLET | ORAL | Status: AC
  Filled 2021-01-28: qty 1

## 2021-01-28 MED ORDER — 0.9 % SODIUM CHLORIDE (POUR BTL) OPTIME
TOPICAL | Status: DC | PRN
  Administered 2021-01-28: 1000 mL

## 2021-01-28 MED ORDER — METOCLOPRAMIDE HCL 5 MG PO TABS: 5.0000 mg | ORAL_TABLET | Freq: Three times a day (TID) | ORAL | Status: DC | PRN

## 2021-01-28 MED ORDER — CHLORHEXIDINE GLUCONATE 4 % EX LIQD: 60.0000 mL | Freq: Once | CUTANEOUS | Status: DC

## 2021-01-28 MED ORDER — PHENOL 1.4 % MT LIQD: 1.0000 | OROMUCOSAL | Status: DC | PRN

## 2021-01-28 MED ORDER — FENTANYL CITRATE (PF) 100 MCG/2ML IJ SOLN: 50.0000 ug | Freq: Once | INTRAMUSCULAR | Status: AC

## 2021-01-28 MED ORDER — OXYCODONE HCL 5 MG PO TABS: 5.0000 mg | ORAL_TABLET | ORAL | Status: DC | PRN

## 2021-01-28 MED ORDER — ACETAMINOPHEN 325 MG PO TABS
325.0000 mg | ORAL_TABLET | Freq: Four times a day (QID) | ORAL | Status: DC | PRN
  Administered 2021-02-02 (×2): 650 mg via ORAL
  Filled 2021-01-28 (×2): qty 2

## 2021-01-28 MED ORDER — SUGAMMADEX SODIUM 200 MG/2ML IV SOLN
INTRAVENOUS | Status: DC | PRN
  Administered 2021-01-28: 150 mg via INTRAVENOUS

## 2021-01-28 MED ORDER — PROPOFOL 10 MG/ML IV BOLUS
INTRAVENOUS | Status: AC
  Filled 2021-01-28: qty 20

## 2021-01-28 MED ORDER — FENTANYL CITRATE (PF) 250 MCG/5ML IJ SOLN
INTRAMUSCULAR | Status: AC
  Filled 2021-01-28: qty 5

## 2021-01-28 MED ORDER — HYDROMORPHONE HCL 1 MG/ML IJ SOLN: 0.5000 mg | INTRAMUSCULAR | Status: DC | PRN

## 2021-01-28 MED ORDER — ORAL CARE MOUTH RINSE: 15.0000 mL | Freq: Once | OROMUCOSAL | Status: AC

## 2021-01-28 MED ORDER — ALUM & MAG HYDROXIDE-SIMETH 200-200-20 MG/5ML PO SUSP: 30.0000 mL | ORAL | Status: DC | PRN

## 2021-01-28 MED ORDER — MUPIROCIN 2 % EX OINT
TOPICAL_OINTMENT | Freq: Two times a day (BID) | CUTANEOUS | Status: DC
  Administered 2021-01-28 – 2021-02-03 (×3): 1 via TOPICAL
  Filled 2021-01-28 (×3): qty 22

## 2021-01-28 MED ORDER — TRANEXAMIC ACID-NACL 1000-0.7 MG/100ML-% IV SOLN
1000.0000 mg | INTRAVENOUS | Status: AC
  Administered 2021-01-28: 1000 mg via INTRAVENOUS
  Filled 2021-01-28: qty 100

## 2021-01-28 MED ORDER — TRANEXAMIC ACID-NACL 1000-0.7 MG/100ML-% IV SOLN
1000.0000 mg | Freq: Once | INTRAVENOUS | Status: AC
  Administered 2021-01-28: 1000 mg via INTRAVENOUS
  Filled 2021-01-28: qty 100

## 2021-01-28 MED ORDER — TRAMADOL HCL 50 MG PO TABS
50.0000 mg | ORAL_TABLET | Freq: Four times a day (QID) | ORAL | Status: DC
  Administered 2021-01-29 – 2021-02-03 (×23): 50 mg via ORAL
  Filled 2021-01-28 (×24): qty 1

## 2021-01-28 MED ORDER — ROCURONIUM BROMIDE 10 MG/ML (PF) SYRINGE
PREFILLED_SYRINGE | INTRAVENOUS | Status: DC | PRN
  Administered 2021-01-28: 20 mg via INTRAVENOUS
  Administered 2021-01-28: 50 mg via INTRAVENOUS
  Administered 2021-01-28: 20 mg via INTRAVENOUS

## 2021-01-28 MED ORDER — FENTANYL CITRATE (PF) 100 MCG/2ML IJ SOLN
INTRAMUSCULAR | Status: AC
  Administered 2021-01-28: 50 ug via INTRAVENOUS
  Filled 2021-01-28: qty 2

## 2021-01-28 MED ORDER — DEXAMETHASONE SODIUM PHOSPHATE 10 MG/ML IJ SOLN
INTRAMUSCULAR | Status: DC | PRN
  Administered 2021-01-28: 10 mg via INTRAVENOUS

## 2021-01-28 MED ORDER — CEFAZOLIN SODIUM-DEXTROSE 2-4 GM/100ML-% IV SOLN
2.0000 g | Freq: Four times a day (QID) | INTRAVENOUS | Status: AC
  Administered 2021-01-29 (×2): 2 g via INTRAVENOUS
  Filled 2021-01-28 (×2): qty 100

## 2021-01-28 MED ORDER — PANTOPRAZOLE SODIUM 40 MG PO TBEC
40.0000 mg | DELAYED_RELEASE_TABLET | Freq: Every day | ORAL | Status: DC
  Administered 2021-01-29 – 2021-02-03 (×5): 40 mg via ORAL
  Filled 2021-01-28 (×5): qty 1

## 2021-01-28 MED ORDER — HYDROCODONE-ACETAMINOPHEN 5-325 MG PO TABS: 1.0000 | ORAL_TABLET | ORAL | Status: DC | PRN

## 2021-01-28 MED ORDER — ACETAMINOPHEN 500 MG PO TABS
1000.0000 mg | ORAL_TABLET | Freq: Four times a day (QID) | ORAL | Status: AC
  Administered 2021-01-29 (×3): 1000 mg via ORAL
  Filled 2021-01-28 (×4): qty 2

## 2021-01-28 MED ORDER — BISACODYL 10 MG RE SUPP: 10.0000 mg | Freq: Every day | RECTAL | Status: DC | PRN

## 2021-01-28 MED ORDER — METHOCARBAMOL 500 MG PO TABS
500.0000 mg | ORAL_TABLET | Freq: Four times a day (QID) | ORAL | Status: DC | PRN
  Administered 2021-02-01 – 2021-02-02 (×3): 500 mg via ORAL
  Filled 2021-01-28 (×3): qty 1

## 2021-01-28 MED ORDER — ONDANSETRON HCL 4 MG PO TABS: 4.0000 mg | ORAL_TABLET | Freq: Four times a day (QID) | ORAL | Status: DC | PRN

## 2021-01-28 MED ORDER — ACETAMINOPHEN 325 MG PO TABS: 325.0000 mg | ORAL_TABLET | Freq: Four times a day (QID) | ORAL | Status: DC | PRN

## 2021-01-28 MED ORDER — ONDANSETRON HCL 4 MG/2ML IJ SOLN
INTRAMUSCULAR | Status: DC | PRN
  Administered 2021-01-28: 4 mg via INTRAVENOUS

## 2021-01-28 MED ORDER — ALBUTEROL SULFATE HFA 108 (90 BASE) MCG/ACT IN AERS: 1.0000 | INHALATION_SPRAY | Freq: Four times a day (QID) | RESPIRATORY_TRACT | Status: DC | PRN

## 2021-01-28 MED ORDER — METOCLOPRAMIDE HCL 5 MG/ML IJ SOLN
5.0000 mg | Freq: Three times a day (TID) | INTRAMUSCULAR | Status: DC | PRN
Start: 2021-01-28 — End: 2021-02-04

## 2021-01-28 MED ORDER — FENTANYL CITRATE (PF) 250 MCG/5ML IJ SOLN
INTRAMUSCULAR | Status: DC | PRN
  Administered 2021-01-28 (×5): 50 ug via INTRAVENOUS

## 2021-01-28 MED ORDER — FENTANYL CITRATE (PF) 100 MCG/2ML IJ SOLN: 25.0000 ug | INTRAMUSCULAR | Status: DC | PRN

## 2021-01-28 MED ORDER — HYDROCODONE-ACETAMINOPHEN 7.5-325 MG PO TABS
1.0000 | ORAL_TABLET | ORAL | Status: DC | PRN
  Administered 2021-02-01: 1 via ORAL
  Filled 2021-01-28: qty 1

## 2021-01-28 MED ORDER — PROMETHAZINE HCL 25 MG/ML IJ SOLN: 6.2500 mg | INTRAMUSCULAR | Status: DC | PRN

## 2021-01-28 MED ORDER — MAGNESIUM CITRATE PO SOLN: 1.0000 | Freq: Once | ORAL | Status: DC | PRN

## 2021-01-28 MED ORDER — MIDAZOLAM HCL 2 MG/2ML IJ SOLN
INTRAMUSCULAR | Status: AC
  Filled 2021-01-28: qty 2

## 2021-01-28 MED ORDER — MIDAZOLAM HCL 2 MG/2ML IJ SOLN: 2.0000 mg | Freq: Once | INTRAMUSCULAR | Status: AC

## 2021-01-28 MED ORDER — OXYCODONE HCL 5 MG/5ML PO SOLN
5.0000 mg | Freq: Once | ORAL | Status: AC | PRN
Start: 2021-01-28 — End: 2021-01-28

## 2021-01-28 MED ORDER — ONDANSETRON HCL 4 MG/2ML IJ SOLN: 4.0000 mg | Freq: Four times a day (QID) | INTRAMUSCULAR | Status: DC | PRN

## 2021-01-28 MED ORDER — OXYCODONE HCL 5 MG PO TABS
10.0000 mg | ORAL_TABLET | ORAL | Status: DC | PRN
  Administered 2021-01-31: 10 mg via ORAL
  Filled 2021-01-28: qty 2

## 2021-01-28 MED ORDER — METOCLOPRAMIDE HCL 5 MG/ML IJ SOLN
5.0000 mg | Freq: Three times a day (TID) | INTRAMUSCULAR | Status: DC | PRN
Start: 2021-01-28 — End: 2021-01-28

## 2021-01-28 MED ORDER — DEXAMETHASONE SODIUM PHOSPHATE 10 MG/ML IJ SOLN
INTRAMUSCULAR | Status: DC | PRN
  Administered 2021-01-28: 5 mg

## 2021-01-28 MED ORDER — ACETAMINOPHEN 10 MG/ML IV SOLN: 1000.0000 mg | Freq: Once | INTRAVENOUS | Status: DC | PRN

## 2021-01-28 MED ORDER — CEFAZOLIN SODIUM-DEXTROSE 2-4 GM/100ML-% IV SOLN
2.0000 g | INTRAVENOUS | Status: AC
  Administered 2021-01-28: 2 g via INTRAVENOUS
  Filled 2021-01-28 (×2): qty 100

## 2021-01-28 MED ORDER — LACTATED RINGERS IV SOLN: INTRAVENOUS | Status: DC | PRN

## 2021-01-28 SURGICAL SUPPLY — 47 items
BIT DRILL AO GAMMA 4.2X180 (BIT) ×3 IMPLANT
CLSR STERI-STRIP ANTIMIC 1/2X4 (GAUZE/BANDAGES/DRESSINGS) ×6 IMPLANT
COVER MAYO STAND STRL (DRAPES) IMPLANT
COVER PERINEAL POST (MISCELLANEOUS) ×3 IMPLANT
COVER SURGICAL LIGHT HANDLE (MISCELLANEOUS) ×3 IMPLANT
COVER WAND RF STERILE (DRAPES) IMPLANT
DRAPE STERI IOBAN 125X83 (DRAPES) ×3 IMPLANT
DRESSING MEPILEX FLEX 4X4 (GAUZE/BANDAGES/DRESSINGS) ×4 IMPLANT
DRSG MEPILEX BORDER 4X4 (GAUZE/BANDAGES/DRESSINGS) ×9 IMPLANT
DRSG MEPILEX BORDER 4X8 (GAUZE/BANDAGES/DRESSINGS) ×3 IMPLANT
DRSG MEPILEX FLEX 4X4 (GAUZE/BANDAGES/DRESSINGS) ×6
DURAPREP 26ML APPLICATOR (WOUND CARE) ×3 IMPLANT
ELECT REM PT RETURN 9FT ADLT (ELECTROSURGICAL) ×3
ELECTRODE REM PT RTRN 9FT ADLT (ELECTROSURGICAL) ×2 IMPLANT
GLOVE BIO SURGEON STRL SZ7.5 (GLOVE) ×3 IMPLANT
GLOVE BIOGEL PI IND STRL 7.5 (GLOVE) ×2 IMPLANT
GLOVE BIOGEL PI INDICATOR 7.5 (GLOVE) ×1
GLOVE SRG 8 PF TXTR STRL LF DI (GLOVE) ×2 IMPLANT
GLOVE SURG SYN 7.5  E (GLOVE) ×1
GLOVE SURG SYN 7.5 E (GLOVE) ×2 IMPLANT
GLOVE SURG UNDER POLY LF SZ8 (GLOVE) ×1
GOWN STRL REUS W/ TWL LRG LVL3 (GOWN DISPOSABLE) ×4 IMPLANT
GOWN STRL REUS W/ TWL XL LVL3 (GOWN DISPOSABLE) ×4 IMPLANT
GOWN STRL REUS W/TWL LRG LVL3 (GOWN DISPOSABLE) ×2
GOWN STRL REUS W/TWL XL LVL3 (GOWN DISPOSABLE) ×2
GUIDEROD T2 3X1000 (ROD) ×3 IMPLANT
K-WIRE  3.2X450M STR (WIRE) ×1
K-WIRE 3.2X450M STR (WIRE) ×2
KIT BASIN OR (CUSTOM PROCEDURE TRAY) ×3 IMPLANT
KIT NAIL LONG 10X380MMX125 (Nail) ×3 IMPLANT
KIT TURNOVER KIT B (KITS) ×3 IMPLANT
KWIRE 3.2X450M STR (WIRE) ×2 IMPLANT
MANIFOLD NEPTUNE II (INSTRUMENTS) ×3 IMPLANT
NS IRRIG 1000ML POUR BTL (IV SOLUTION) ×3 IMPLANT
PACK GENERAL/GYN (CUSTOM PROCEDURE TRAY) ×3 IMPLANT
PAD ARMBOARD 7.5X6 YLW CONV (MISCELLANEOUS) ×6 IMPLANT
REAMER SHAFT BIXCUT (INSTRUMENTS) ×3 IMPLANT
SCREW LAG GAMMA 3 TI 10.5X85MM (Screw) ×3 IMPLANT
SCREW LOCKING FULL THREAD 5X52 (Screw) ×3 IMPLANT
SLEEVE CABLE 2MM VT (Orthopedic Implant) ×3 IMPLANT
STRIP CLOSURE SKIN 1/2X4 (GAUZE/BANDAGES/DRESSINGS) ×3 IMPLANT
SUT MNCRL AB 4-0 PS2 18 (SUTURE) ×3 IMPLANT
SUT VIC AB 2-0 CT1 27 (SUTURE) ×1
SUT VIC AB 2-0 CT1 TAPERPNT 27 (SUTURE) ×2 IMPLANT
TOWEL GREEN STERILE (TOWEL DISPOSABLE) ×3 IMPLANT
TOWEL OR NON WOVEN STRL DISP B (DISPOSABLE) ×3 IMPLANT
WATER STERILE IRR 1000ML POUR (IV SOLUTION) ×3 IMPLANT

## 2021-01-28 NOTE — Anesthesia Procedure Notes (Signed)
Procedure Name: Intubation Date/Time: 01/28/2021 2:54 PM Performed by: Thelma Comp, CRNA Pre-anesthesia Checklist: Patient identified, Emergency Drugs available, Suction available and Patient being monitored Patient Re-evaluated:Patient Re-evaluated prior to induction Oxygen Delivery Method: Circle System Utilized Preoxygenation: Pre-oxygenation with 100% oxygen Induction Type: IV induction Ventilation: Two handed mask ventilation required and Oral airway inserted - appropriate to patient size Laryngoscope Size: Mac and 4 Grade View: Grade II Tube type: Oral Tube size: 7.5 mm Number of attempts: 1 Airway Equipment and Method: Stylet and Oral airway Placement Confirmation: ETT inserted through vocal cords under direct vision,  positive ETCO2 and breath sounds checked- equal and bilateral Secured at: 23 cm Tube secured with: Tape Dental Injury: Teeth and Oropharynx as per pre-operative assessment
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
## 2021-01-28 NOTE — Anesthesia Procedure Notes (Signed)
Anesthesia Regional Block: Supraclavicular block   Pre-Anesthetic Checklist: ,, timeout performed, Correct Patient, Correct Site, Correct Laterality, Correct Procedure, Correct Position, site marked, Risks and benefits discussed,  Surgical consent,  Pre-op evaluation,  At surgeon's request and post-op pain management  Laterality: Right  Prep: Dura Prep       Needles:  Injection technique: Single-shot  Needle Type: Echogenic Stimulator Needle     Needle Length: 5cm  Needle Gauge: 20     Additional Needles:   Procedures:,,,, ultrasound used (permanent image in chart),,,,  Narrative:  Start time: 01/28/2021 12:32 PM End time: 01/28/2021 12:36 PM Injection made incrementally with aspirations every 5 mL.  Performed by: Personally  Anesthesiologist: Atilano Median, DO  Additional Notes: Patient identified. Risks/Benefits/Options discussed with patient including but not limited to bleeding, infection, nerve damage, failed block, incomplete pain control. Patient expressed understanding and wished to proceed. All questions were answered. Sterile technique was used throughout the entire procedure. Please see nursing notes for vital signs. Aspirated in 5cc intervals with injection for negative confirmation. Patient was given instructions on fall risk and not to get out of bed. All questions and concerns addressed with instructions to call with any issues or inadequate analgesia.
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
## 2021-01-28 NOTE — Transfer of Care (Signed)
Immediate Anesthesia Transfer of Care Note  Patient: Matthew Franco  Procedure(s) Performed: INTRAMEDULLARY (IM) NAIL FEMORAL (Right Leg Upper)  Patient Location: PACU  Anesthesia Type:General  Level of Consciousness: drowsy and patient cooperative  Airway & Oxygen Therapy: Patient Spontanous Breathing and Patient connected to face mask oxygen  Post-op Assessment: Report given to RN and Post -op Vital signs reviewed and stable  Post vital signs: Reviewed and stable  Last Vitals:  Vitals Value Taken Time  BP 144/96 01/28/21 1725  Temp 36.3 C 01/28/21 1725  Pulse 107 01/28/21 1727  Resp 17 01/28/21 1727  SpO2 93 % 01/28/21 1727  Vitals shown include unvalidated device data.  Last Pain:  Vitals:   01/28/21 1034  TempSrc:   PainSc: 8          Complications: No complications documented.

## 2021-01-28 NOTE — Progress Notes (Signed)
   01/28/21 0000  Assess: MEWS Score  Temp 99.4 F (37.4 C)  BP (!) 155/89  Pulse Rate (!) 107  Resp 18  Level of Consciousness Alert  SpO2 97 %  O2 Device Room Air  Assess: if the MEWS score is Yellow or Red  Were vital signs taken at a resting state? Yes  Focused Assessment No change from prior assessment  Early Detection of Sepsis Score *See Row Information* Low  MEWS guidelines implemented *See Row Information* Yes  Treat  Pain Scale 0-10  Pain Score 4  Pain Type Acute pain  Pain Location Leg  Pain Orientation Right  Take Vital Signs  Increase Vital Sign Frequency  Yellow: Q 2hr X 2 then Q 4hr X 2, if remains yellow, continue Q 4hrs  Escalate  MEWS: Escalate Yellow: discuss with charge nurse/RN and consider discussing with provider and RRT  Notify: Charge Nurse/RN  Name of Charge Nurse/RN Notified Winfield Rast  Date Charge Nurse/RN Notified 01/28/21  Time Charge Nurse/RN Notified 0040  Notify: Provider  Provider Name/Title n/a (pt baselinefrom ED prior to arrival in the unit)  Notify: Rapid Response  Name of Rapid Response RN Notified n/a  Document  Patient Outcome Stabilized after interventions
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
## 2021-01-28 NOTE — Plan of Care (Signed)
  Problem: Activity: Goal: Risk for activity intolerance will decrease Outcome: Progressing   
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
## 2021-01-29 ENCOUNTER — Encounter (HOSPITAL_COMMUNITY): Payer: Self-pay | Admitting: Orthopedic Surgery

## 2021-01-29 DIAGNOSIS — Z79899 Other long term (current) drug therapy: Secondary | ICD-10-CM | POA: Diagnosis not present

## 2021-01-29 DIAGNOSIS — S41111A Laceration without foreign body of right upper arm, initial encounter: Secondary | ICD-10-CM | POA: Diagnosis present

## 2021-01-29 DIAGNOSIS — J45909 Unspecified asthma, uncomplicated: Secondary | ICD-10-CM | POA: Diagnosis present

## 2021-01-29 DIAGNOSIS — S72351A Displaced comminuted fracture of shaft of right femur, initial encounter for closed fracture: Secondary | ICD-10-CM | POA: Diagnosis not present

## 2021-01-29 DIAGNOSIS — Z20822 Contact with and (suspected) exposure to covid-19: Secondary | ICD-10-CM | POA: Diagnosis present

## 2021-01-29 DIAGNOSIS — S82201A Unspecified fracture of shaft of right tibia, initial encounter for closed fracture: Secondary | ICD-10-CM | POA: Diagnosis present

## 2021-01-29 DIAGNOSIS — R030 Elevated blood-pressure reading, without diagnosis of hypertension: Secondary | ICD-10-CM

## 2021-01-29 DIAGNOSIS — R000 Tachycardia, unspecified: Secondary | ICD-10-CM | POA: Diagnosis present

## 2021-01-29 DIAGNOSIS — Y92410 Unspecified street and highway as the place of occurrence of the external cause: Secondary | ICD-10-CM | POA: Diagnosis not present

## 2021-01-29 DIAGNOSIS — S52371A Galeazzi's fracture of right radius, initial encounter for closed fracture: Secondary | ICD-10-CM | POA: Diagnosis present

## 2021-01-29 DIAGNOSIS — S0181XA Laceration without foreign body of other part of head, initial encounter: Secondary | ICD-10-CM | POA: Diagnosis present

## 2021-01-29 DIAGNOSIS — M1990 Unspecified osteoarthritis, unspecified site: Secondary | ICD-10-CM | POA: Diagnosis present

## 2021-01-29 DIAGNOSIS — T1490XA Injury, unspecified, initial encounter: Secondary | ICD-10-CM | POA: Diagnosis present

## 2021-01-29 DIAGNOSIS — Z8616 Personal history of COVID-19: Secondary | ICD-10-CM | POA: Diagnosis not present

## 2021-01-29 DIAGNOSIS — R7303 Prediabetes: Secondary | ICD-10-CM | POA: Diagnosis present

## 2021-01-29 DIAGNOSIS — S7221XA Displaced subtrochanteric fracture of right femur, initial encounter for closed fracture: Secondary | ICD-10-CM | POA: Diagnosis present

## 2021-01-29 DIAGNOSIS — F419 Anxiety disorder, unspecified: Secondary | ICD-10-CM | POA: Diagnosis present

## 2021-01-29 LAB — BASIC METABOLIC PANEL
Anion gap: 6 (ref 5–15)
BUN: 15 mg/dL (ref 6–20)
CO2: 25 mmol/L (ref 22–32)
Calcium: 8.2 mg/dL — ABNORMAL LOW (ref 8.9–10.3)
Chloride: 103 mmol/L (ref 98–111)
Creatinine, Ser: 1.26 mg/dL — ABNORMAL HIGH (ref 0.61–1.24)
GFR, Estimated: >60 mL/min (ref >60)
Glucose, Bld: 136 mg/dL — ABNORMAL HIGH (ref 70–99)
Potassium: 4.3 mmol/L (ref 3.5–5.1)
Sodium: 134 mmol/L — ABNORMAL LOW (ref 135–145)

## 2021-01-29 LAB — CBC
HCT: 33.9 % — ABNORMAL LOW (ref 39.0–52.0)
Hemoglobin: 11.4 g/dL — ABNORMAL LOW (ref 13.0–17.0)
MCH: 30 pg (ref 26.0–34.0)
MCHC: 33.6 g/dL (ref 30.0–36.0)
MCV: 89.2 fL (ref 80.0–100.0)
Platelets: 193 10*3/uL (ref 150–400)
RBC: 3.8 MIL/uL — ABNORMAL LOW (ref 4.22–5.81)
RDW: 13.7 % (ref 11.5–15.5)
WBC: 10.8 10*3/uL — ABNORMAL HIGH (ref 4.0–10.5)
nRBC: 0 % (ref 0.0–0.2)

## 2021-01-29 MED ORDER — ENSURE PRE-SURGERY PO LIQD
296.0000 mL | Freq: Once | ORAL | Status: AC
  Administered 2021-01-29: 296 mL via ORAL
  Filled 2021-01-29: qty 296

## 2021-01-29 MED ORDER — SODIUM CHLORIDE 0.9 % IV BOLUS
500.0000 mL | Freq: Once | INTRAVENOUS | Status: AC
  Administered 2021-01-29: 500 mL via INTRAVENOUS

## 2021-01-29 MED ORDER — HYDRALAZINE HCL 10 MG PO TABS: 10.0000 mg | ORAL_TABLET | Freq: Three times a day (TID) | ORAL | Status: DC | PRN
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
## 2021-01-29 NOTE — Plan of Care (Signed)
See progress notes. Patient is now in the [place]. Yellow MEWS protocol still in place. Patient is not complaining of any pain at this time. 500 cc fluid bolus given. Patient can now lift up right arm independently and can wiggle all fingers. Will continue to monitor and continue current POC.
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
## 2021-01-29 NOTE — Progress Notes (Signed)
Cross-coverage note:   Patient tachycardic to 120s. He has mild leg pain that is being treated but no other complaints and other vitals normal. EKG with sinus rhythm. Plan to give 500 cc NS, continue pain-control and monitoring.

## 2021-01-29 NOTE — Progress Notes (Signed)
01/29/21 0015 01/29/21 0215 01/29/21 0240  Assess: MEWS Score  Temp 98.3 F (36.8 C) 98.4 F (36.9 C) 98.7 F (37.1 C)  BP (!) 141/85 (!) 146/81 (!) 153/93  Pulse Rate (!) 121 (!) 109 (!) 110  Resp 17 18 16   SpO2 95 % 95 % 94 %  O2 Device Room Air Room Air Room Air  Assess: MEWS Score  MEWS Temp 0 0 0  MEWS Systolic 0 0 0  MEWS Pulse 2 1 1   MEWS RR 0 0 0  MEWS LOC 0 0 0  MEWS Score 2 1 1   MEWS Score Color Yellow Green Green  Assess: if the MEWS score is Yellow or Red  Were vital signs taken at a resting state? Yes  --   --   Focused Assessment No change from prior assessment  --   --   Early Detection of Sepsis Score *See Row Information* Low  --   --   MEWS guidelines implemented *See Row Information* Yes  --   --   Treat  MEWS Interventions Administered scheduled meds/treatments;Escalated (See documentation below)  --   --   Pain Scale 0-10  --   --   Pain Score 2  --   --   Pain Type Surgical pain  --   --   Pain Location Leg  --   --   Pain Orientation Right  --   --   Take Vital Signs  Increase Vital Sign Frequency  Yellow: Q 2hr X 2 then Q 4hr X 2, if remains yellow, continue Q 4hrs  --   --   Escalate  MEWS: Escalate Yellow: discuss with charge nurse/RN and consider discussing with provider and RRT  --   --   Notify: Charge Nurse/RN  Name of Charge Nurse/RN , RN  --   --   Date Charge Nurse/RN Notified 01/29/21  --   --   Time Charge Nurse/RN Notified 0025  --   --   Notify: Provider  Provider Name/Title Opyd, MD  --   --   Date Provider Notified 01/29/21  --   --   Time Provider Notified [phone]  --   --   Notification Type Page  --   --   Notification Reason Change in status (HR in 120s at resting state/mews protocol started)  --   --   Provider response See new orders (called back - will order ekg)  --   --   Date of Provider Response 01/29/21  --   --   Time of Provider Response [phone]  --   --   Document  Patient Outcome Stabilized after  interventions  --   --   Progress note created (see row info) Yes  --   --     01/29/21 0358  Assess: MEWS Score  Temp 98.4 F (36.9 C)  BP (!) 146/81  Pulse Rate (!) 109  Resp 16  SpO2 95 %  O2 Device Room Air  Assess: MEWS Score  MEWS Temp 0  MEWS Systolic 0  MEWS Pulse 1  MEWS RR 0  MEWS LOC 0  MEWS Score 1  MEWS Score Color Green  Assess: if the MEWS score is Yellow or Red  Were vital signs taken at a resting state?  --   Focused Assessment  --   Early Detection of Sepsis Score *See Row Information*  --   MEWS guidelines implemented *See Row Information*  --  Treat  MEWS Interventions  --   Pain Scale  --   Pain Score  --   Pain Type  --   Pain Location  --   Pain Orientation  --   Take Vital Signs  Increase Vital Sign Frequency   --   Escalate  MEWS: Escalate  --   Notify: Charge Nurse/RN  Name of Charge Nurse/RN Notified  --   Date Charge Nurse/RN Notified  --   Time Charge Nurse/RN Notified  --   Notify: Provider  Provider Name/Title  --   Date Provider Notified  --   Time Provider Notified  --   Notification Type  --   Notification Reason  --   Provider response  --   Date of Provider Response  --   Time of Provider Response  --   Document  Patient Outcome  --   Progress note created (see row info)  --

## 2021-01-29 NOTE — Anesthesia Postprocedure Evaluation (Signed)
Anesthesia Post Note  Patient: Matthew Franco  Procedure(s) Performed: INTRAMEDULLARY (IM) NAIL FEMORAL (Right Leg Upper)     Patient location during evaluation: PACU Anesthesia Type: Regional Level of consciousness: awake and alert Pain management: pain level controlled Vital Signs Assessment: post-procedure vital signs reviewed and stable Respiratory status: spontaneous breathing, nonlabored ventilation, respiratory function stable and patient connected to nasal cannula oxygen Cardiovascular status: blood pressure returned to baseline and stable Postop Assessment: no apparent nausea or vomiting Anesthetic complications: no   No complications documented.  Last Vitals:  Vitals:   01/29/21 0358 01/29/21 0415  BP: (!) 146/81 (!) 146/81  Pulse:  (!) 109  Resp:  18  Temp:  36.7 C  SpO2:  95%    Last Pain:  Vitals:   01/29/21 0636  TempSrc:   PainSc: 8                  Piper Albro P Breslin Hemann

## 2021-01-30 ENCOUNTER — Encounter (HOSPITAL_COMMUNITY)
Admission: EM | Disposition: A | Discharge status: Home Health | Payer: Self-pay | Source: Home / Self Care | Attending: Orthopedic Surgery

## 2021-01-30 ENCOUNTER — Inpatient Hospital Stay (HOSPITAL_COMMUNITY): Payer: 59 | Admitting: Anesthesiology

## 2021-01-30 ENCOUNTER — Encounter (HOSPITAL_COMMUNITY): Payer: Self-pay | Admitting: Orthopedic Surgery

## 2021-01-30 ENCOUNTER — Inpatient Hospital Stay (HOSPITAL_COMMUNITY): Payer: 59

## 2021-01-30 HISTORY — PX: ORIF RADIAL FRACTURE: SHX5113

## 2021-01-30 LAB — CBC
HCT: 30.7 % — ABNORMAL LOW (ref 39.0–52.0)
Hemoglobin: 10.4 g/dL — ABNORMAL LOW (ref 13.0–17.0)
MCH: 30.8 pg (ref 26.0–34.0)
MCHC: 33.9 g/dL (ref 30.0–36.0)
MCV: 90.8 fL (ref 80.0–100.0)
Platelets: 179 10*3/uL (ref 150–400)
RBC: 3.38 MIL/uL — ABNORMAL LOW (ref 4.22–5.81)
RDW: 13.7 % (ref 11.5–15.5)
WBC: 9.3 10*3/uL (ref 4.0–10.5)
nRBC: 0 % (ref 0.0–0.2)

## 2021-01-30 LAB — BASIC METABOLIC PANEL
Anion gap: 7 (ref 5–15)
BUN: 17 mg/dL (ref 6–20)
CO2: 25 mmol/L (ref 22–32)
Calcium: 8.1 mg/dL — ABNORMAL LOW (ref 8.9–10.3)
Chloride: 103 mmol/L (ref 98–111)
Creatinine, Ser: 1.17 mg/dL (ref 0.61–1.24)
GFR, Estimated: >60 mL/min (ref >60)
Glucose, Bld: 115 mg/dL — ABNORMAL HIGH (ref 70–99)
Potassium: 4 mmol/L (ref 3.5–5.1)
Sodium: 135 mmol/L (ref 135–145)

## 2021-01-30 SURGERY — OPEN REDUCTION INTERNAL FIXATION (ORIF) RADIAL FRACTURE
Anesthesia: General | Site: Arm Lower | Laterality: Right

## 2021-01-30 MED ORDER — METHOCARBAMOL 500 MG PO TABS: 500.0000 mg | ORAL_TABLET | Freq: Four times a day (QID) | ORAL | Status: DC | PRN

## 2021-01-30 MED ORDER — AMISULPRIDE (ANTIEMETIC) 5 MG/2ML IV SOLN: 10.0000 mg | Freq: Once | INTRAVENOUS | Status: DC | PRN

## 2021-01-30 MED ORDER — TRAMADOL HCL 50 MG PO TABS: 50.0000 mg | ORAL_TABLET | Freq: Four times a day (QID) | ORAL | Status: DC

## 2021-01-30 MED ORDER — MIDAZOLAM HCL 2 MG/2ML IJ SOLN
INTRAMUSCULAR | Status: DC | PRN
  Administered 2021-01-30: 2 mg via INTRAVENOUS

## 2021-01-30 MED ORDER — CHLORHEXIDINE GLUCONATE 0.12 % MT SOLN: 15.0000 mL | Freq: Once | OROMUCOSAL | Status: AC

## 2021-01-30 MED ORDER — PROPOFOL 10 MG/ML IV BOLUS
INTRAVENOUS | Status: DC | PRN
  Administered 2021-01-30: 180 mg via INTRAVENOUS

## 2021-01-30 MED ORDER — MIDAZOLAM HCL 2 MG/2ML IJ SOLN
INTRAMUSCULAR | Status: AC
  Filled 2021-01-30: qty 2

## 2021-01-30 MED ORDER — OXYCODONE HCL 5 MG PO TABS: 5.0000 mg | ORAL_TABLET | ORAL | Status: DC | PRN

## 2021-01-30 MED ORDER — OXYCODONE HCL 5 MG PO TABS: 10.0000 mg | ORAL_TABLET | ORAL | Status: DC | PRN

## 2021-01-30 MED ORDER — CEFAZOLIN SODIUM-DEXTROSE 1-4 GM/50ML-% IV SOLN
1.0000 g | Freq: Three times a day (TID) | INTRAVENOUS | Status: DC
  Administered 2021-01-30 – 2021-02-03 (×13): 1 g via INTRAVENOUS
  Filled 2021-01-30 (×15): qty 50

## 2021-01-30 MED ORDER — ROCURONIUM BROMIDE 10 MG/ML (PF) SYRINGE
PREFILLED_SYRINGE | INTRAVENOUS | Status: AC
  Filled 2021-01-30: qty 10

## 2021-01-30 MED ORDER — DEXAMETHASONE SODIUM PHOSPHATE 10 MG/ML IJ SOLN
INTRAMUSCULAR | Status: DC | PRN
  Administered 2021-01-30: 10 mg via INTRAVENOUS

## 2021-01-30 MED ORDER — DEXAMETHASONE SODIUM PHOSPHATE 10 MG/ML IJ SOLN
INTRAMUSCULAR | Status: AC
  Filled 2021-01-30: qty 1

## 2021-01-30 MED ORDER — LACTATED RINGERS IV SOLN: INTRAVENOUS | Status: DC

## 2021-01-30 MED ORDER — ONDANSETRON HCL 4 MG/2ML IJ SOLN
INTRAMUSCULAR | Status: AC
  Filled 2021-01-30: qty 2

## 2021-01-30 MED ORDER — PROMETHAZINE HCL 25 MG/ML IJ SOLN: 6.2500 mg | INTRAMUSCULAR | Status: DC | PRN

## 2021-01-30 MED ORDER — FENTANYL CITRATE (PF) 250 MCG/5ML IJ SOLN
INTRAMUSCULAR | Status: AC
  Filled 2021-01-30: qty 5

## 2021-01-30 MED ORDER — DIPHENHYDRAMINE HCL 25 MG PO CAPS: 25.0000 mg | ORAL_CAPSULE | Freq: Four times a day (QID) | ORAL | Status: DC | PRN

## 2021-01-30 MED ORDER — 0.9 % SODIUM CHLORIDE (POUR BTL) OPTIME
TOPICAL | Status: DC | PRN
  Administered 2021-01-30: 1000 mL

## 2021-01-30 MED ORDER — CHLORHEXIDINE GLUCONATE 0.12 % MT SOLN
OROMUCOSAL | Status: AC
  Administered 2021-01-30: 15 mL via OROMUCOSAL
  Filled 2021-01-30: qty 15

## 2021-01-30 MED ORDER — ACETAMINOPHEN 325 MG PO TABS: 325.0000 mg | ORAL_TABLET | Freq: Four times a day (QID) | ORAL | Status: DC | PRN

## 2021-01-30 MED ORDER — OXYCODONE HCL 5 MG PO TABS: 5.0000 mg | ORAL_TABLET | Freq: Once | ORAL | Status: DC | PRN

## 2021-01-30 MED ORDER — PROPOFOL 10 MG/ML IV BOLUS
INTRAVENOUS | Status: AC
  Filled 2021-01-30: qty 20

## 2021-01-30 MED ORDER — ORAL CARE MOUTH RINSE: 15.0000 mL | Freq: Once | OROMUCOSAL | Status: AC

## 2021-01-30 MED ORDER — METHOCARBAMOL 1000 MG/10ML IJ SOLN: 500.0000 mg | Freq: Four times a day (QID) | INTRAVENOUS | Status: DC | PRN

## 2021-01-30 MED ORDER — PANTOPRAZOLE SODIUM 40 MG PO TBEC: 40.0000 mg | DELAYED_RELEASE_TABLET | Freq: Two times a day (BID) | ORAL | Status: DC | PRN

## 2021-01-30 MED ORDER — FENTANYL CITRATE (PF) 100 MCG/2ML IJ SOLN
25.0000 ug | INTRAMUSCULAR | Status: DC | PRN
  Administered 2021-01-30: 50 ug via INTRAVENOUS

## 2021-01-30 MED ORDER — FENTANYL CITRATE (PF) 100 MCG/2ML IJ SOLN
INTRAMUSCULAR | Status: AC
  Filled 2021-01-30: qty 2

## 2021-01-30 MED ORDER — FENTANYL CITRATE (PF) 100 MCG/2ML IJ SOLN
INTRAMUSCULAR | Status: DC | PRN
  Administered 2021-01-30: 100 ug via INTRAVENOUS
  Administered 2021-01-30: 25 ug via INTRAVENOUS
  Administered 2021-01-30: 50 ug via INTRAVENOUS
  Administered 2021-01-30: 25 ug via INTRAVENOUS

## 2021-01-30 MED ORDER — KETOROLAC TROMETHAMINE 30 MG/ML IJ SOLN
INTRAMUSCULAR | Status: AC
  Filled 2021-01-30: qty 1

## 2021-01-30 MED ORDER — LIDOCAINE 2% (20 MG/ML) 5 ML SYRINGE
INTRAMUSCULAR | Status: AC
  Filled 2021-01-30: qty 5

## 2021-01-30 MED ORDER — KETOROLAC TROMETHAMINE 30 MG/ML IJ SOLN
30.0000 mg | Freq: Once | INTRAMUSCULAR | Status: AC
  Administered 2021-01-30: 30 mg via INTRAVENOUS

## 2021-01-30 MED ORDER — CEFAZOLIN SODIUM-DEXTROSE 1-4 GM/50ML-% IV SOLN
1.0000 g | INTRAVENOUS | Status: AC
  Administered 2021-01-30: 1 g via INTRAVENOUS
  Filled 2021-01-30: qty 50

## 2021-01-30 MED ORDER — OXYCODONE HCL 5 MG/5ML PO SOLN: 5.0000 mg | Freq: Once | ORAL | Status: DC | PRN

## 2021-01-30 MED ORDER — CEFAZOLIN SODIUM-DEXTROSE 2-3 GM-%(50ML) IV SOLR
INTRAVENOUS | Status: DC | PRN
  Administered 2021-01-30: 2 g via INTRAVENOUS

## 2021-01-30 MED ORDER — ACETAMINOPHEN 10 MG/ML IV SOLN: 1000.0000 mg | Freq: Once | INTRAVENOUS | Status: DC | PRN

## 2021-01-30 MED ORDER — CEFAZOLIN SODIUM-DEXTROSE 2-4 GM/100ML-% IV SOLN
INTRAVENOUS | Status: AC
  Filled 2021-01-30: qty 100

## 2021-01-30 MED ORDER — ONDANSETRON HCL 4 MG/2ML IJ SOLN
INTRAMUSCULAR | Status: DC | PRN
  Administered 2021-01-30: 4 mg via INTRAVENOUS

## 2021-01-30 MED ORDER — HYDROMORPHONE HCL 1 MG/ML IJ SOLN: 0.5000 mg | INTRAMUSCULAR | Status: DC | PRN

## 2021-01-30 MED ORDER — ACETAMINOPHEN 500 MG PO TABS
1000.0000 mg | ORAL_TABLET | Freq: Four times a day (QID) | ORAL | Status: AC
  Administered 2021-01-30 – 2021-01-31 (×3): 1000 mg via ORAL
  Filled 2021-01-30 (×3): qty 2

## 2021-01-30 MED ORDER — DEXMEDETOMIDINE (PRECEDEX) IN NS 20 MCG/5ML (4 MCG/ML) IV SYRINGE
PREFILLED_SYRINGE | INTRAVENOUS | Status: DC | PRN
  Administered 2021-01-30: 8 ug via INTRAVENOUS

## 2021-01-30 SURGICAL SUPPLY — 63 items
BENZOIN TINCTURE PRP APPL 2/3 (GAUZE/BANDAGES/DRESSINGS) ×2 IMPLANT
BIT DRILL 3.5X122MM AO FIT (BIT) ×1 IMPLANT
BLADE CLIPPER SURG (BLADE) IMPLANT
BNDG COHESIVE 4X5 TAN STRL (GAUZE/BANDAGES/DRESSINGS) ×2 IMPLANT
BNDG ELASTIC 3X5.8 VLCR STR LF (GAUZE/BANDAGES/DRESSINGS) ×2 IMPLANT
BNDG ELASTIC 4X5.8 VLCR STR LF (GAUZE/BANDAGES/DRESSINGS) ×3 IMPLANT
BNDG ESMARK 4X9 LF (GAUZE/BANDAGES/DRESSINGS) ×2 IMPLANT
CORD BIPOLAR FORCEPS 12FT (ELECTRODE) ×2 IMPLANT
COUNTERSINK (MISCELLANEOUS) ×2
COVER SURGICAL LIGHT HANDLE (MISCELLANEOUS) ×4 IMPLANT
COVER WAND RF STERILE (DRAPES) ×2 IMPLANT
CUFF TOURN SGL QUICK 18X4 (TOURNIQUET CUFF) ×2 IMPLANT
CUFF TOURN SGL QUICK 24 (TOURNIQUET CUFF)
CUFF TRNQT CYL 24X4X16.5-23 (TOURNIQUET CUFF) IMPLANT
DECANTER SPIKE VIAL GLASS SM (MISCELLANEOUS) IMPLANT
DRAPE OEC MINIVIEW 54X84 (DRAPES) IMPLANT
DRAPE U-SHAPE 47X51 STRL (DRAPES) ×2 IMPLANT
DRILL 2.6X122MM WL AO SHAFT (BIT) ×1 IMPLANT
DRSG PAD ABDOMINAL 8X10 ST (GAUZE/BANDAGES/DRESSINGS) ×1 IMPLANT
ELECT REM PT RETURN 9FT ADLT (ELECTROSURGICAL)
ELECTRODE REM PT RTRN 9FT ADLT (ELECTROSURGICAL) IMPLANT
GAUZE SPONGE 4X4 12PLY STRL (GAUZE/BANDAGES/DRESSINGS) ×2 IMPLANT
GAUZE XEROFORM 1X8 LF (GAUZE/BANDAGES/DRESSINGS) ×2 IMPLANT
GLOVE BIO SURGEON STRL SZ7.5 (GLOVE) ×2 IMPLANT
GLOVE BIOGEL PI IND STRL 7.5 (GLOVE) ×1 IMPLANT
GLOVE BIOGEL PI INDICATOR 7.5 (GLOVE) ×1
GLOVE SRG 8 PF TXTR STRL LF DI (GLOVE) ×1 IMPLANT
GLOVE SURG SYN 7.5  E (GLOVE) ×1
GLOVE SURG SYN 7.5 E (GLOVE) ×1 IMPLANT
GLOVE SURG SYN 7.5 PF PI (GLOVE) ×1 IMPLANT
GLOVE SURG UNDER POLY LF SZ8 (GLOVE) ×1
GOWN STRL REUS W/ TWL LRG LVL3 (GOWN DISPOSABLE) ×1 IMPLANT
GOWN STRL REUS W/ TWL XL LVL3 (GOWN DISPOSABLE) ×2 IMPLANT
GOWN STRL REUS W/TWL LRG LVL3 (GOWN DISPOSABLE) ×1
GOWN STRL REUS W/TWL XL LVL3 (GOWN DISPOSABLE) ×2
KIT BASIN OR (CUSTOM PROCEDURE TRAY) ×2 IMPLANT
KIT TURNOVER KIT B (KITS) ×2 IMPLANT
MANIFOLD NEPTUNE II (INSTRUMENTS) ×2 IMPLANT
NDL HYPO 25GX1X1/2 BEV (NEEDLE) IMPLANT
NEEDLE HYPO 25GX1X1/2 BEV (NEEDLE) IMPLANT
NS IRRIG 1000ML POUR BTL (IV SOLUTION) ×4 IMPLANT
PACK ORTHO EXTREMITY (CUSTOM PROCEDURE TRAY) ×2 IMPLANT
PAD ARMBOARD 7.5X6 YLW CONV (MISCELLANEOUS) ×4 IMPLANT
PAD CAST 4YDX4 CTTN HI CHSV (CAST SUPPLIES) ×1 IMPLANT
PADDING CAST ABS 4INX4YD NS (CAST SUPPLIES) ×1
PADDING CAST ABS COTTON 4X4 ST (CAST SUPPLIES) IMPLANT
PADDING CAST COTTON 4X4 STRL (CAST SUPPLIES) ×1
PLATE LOCK NARROW 7H/90 NS (Plate) ×1 IMPLANT
SCREW BONE 18 (Screw) ×5 IMPLANT
SCREW BONE 3.5X16MM (Screw) ×3 IMPLANT
SCREW COUNTERSINK (MISCELLANEOUS) IMPLANT
SPONGE LAP 4X18 RFD (DISPOSABLE) ×2 IMPLANT
STRIP CLOSURE SKIN 1/2X4 (GAUZE/BANDAGES/DRESSINGS) ×2 IMPLANT
SUT MNCRL AB 4-0 PS2 18 (SUTURE) ×2 IMPLANT
SUT VIC AB 2-0 SH 27 (SUTURE) ×1
SUT VIC AB 2-0 SH 27XBRD (SUTURE) IMPLANT
SYR CONTROL 10ML LL (SYRINGE) IMPLANT
TOWEL GREEN STERILE (TOWEL DISPOSABLE) ×2 IMPLANT
TOWEL GREEN STERILE FF (TOWEL DISPOSABLE) ×2 IMPLANT
TOWEL OR NON WOVEN STRL DISP B (DISPOSABLE) ×2 IMPLANT
TUBE CONNECTING 12X1/4 (SUCTIONS) ×2 IMPLANT
WATER STERILE IRR 1000ML POUR (IV SOLUTION) ×4 IMPLANT
YANKAUER SUCT BULB TIP NO VENT (SUCTIONS) IMPLANT

## 2021-01-30 NOTE — Progress Notes (Signed)
There was a false yellow mews this morning. Patient's heart rate was high at 104 however the SBP was charted in error at 412, it was corrected and patient went back to green mews.

## 2021-01-30 NOTE — Anesthesia Postprocedure Evaluation (Signed)
Anesthesia Post Note  Patient: Matthew Franco  Procedure(s) Performed: OPEN REDUCTION INTERNAL FIXATION (ORIF) RIGHT RADIUS FRACTURE (Right Arm Lower)     Patient location during evaluation: PACU Anesthesia Type: General Level of consciousness: awake and alert Pain management: pain level controlled Vital Signs Assessment: post-procedure vital signs reviewed and stable Respiratory status: spontaneous breathing, nonlabored ventilation, respiratory function stable and patient connected to nasal cannula oxygen Cardiovascular status: blood pressure returned to baseline and stable Postop Assessment: no apparent nausea or vomiting Anesthetic complications: no   No complications documented.  Last Vitals:  Vitals:   01/30/21 1239 01/30/21 1258  BP: (!) 151/83 127/84  Pulse: 99 97  Resp: 12 15  Temp: 36.8 C 36.6 C  SpO2: 93% 91%    Last Pain:  Vitals:   01/30/21 1258  TempSrc: Oral  PainSc: Asleep                 Nelle Don Tykeshia Tourangeau
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
## 2021-01-30 NOTE — TOC CAGE-AID Note (Signed)
Transition of Care [facility]) - CAGE-AID Screening   Patient Details  Name: Matthew Franco MRN: [id number] Date of Birth: [birth year]     Hewitt Shorts, RN Phone Number: 01/30/2021, 4:27 PM       CAGE-AID Screening:    Have You Ever Felt You Ought to Cut Down on Your Drinking or Drug Use?: No Have People Annoyed You By [employer] Your Drinking Or Drug Use?: No Have You Felt Bad Or Guilty About Your Drinking Or Drug Use?: No Have You Ever Had a Drink or Used Drugs First Thing In The Morning to Steady Your Nerves or to Get Rid of a Hangover?: No CAGE-AID Score: 0  Substance Abuse Education Offered: No (denies alcohol/drug use)

## 2021-01-30 NOTE — Progress Notes (Signed)
PT Cancellation Note  Patient Details Name: DAJON LAZAR MRN: [id number] DOB: 07/07/1971   Cancelled Treatment:    Reason Eval/Treat Not Completed: (P) Patient at procedure or test/unavailable (Pt off unit for ORIF of R radius.  Will continue efforts per POC.)   Evelynne Spiers Artis Delay 01/30/2021, 10:03 AM Bonney Leitz , PTA Acute Rehabilitation Services Pager [phone] Office [phone]

## 2021-01-30 NOTE — Anesthesia Procedure Notes (Signed)
Procedure Name: LMA Insertion Date/Time: 01/30/2021 9:53 AM Performed by: Jodell Cipro, CRNA Pre-anesthesia Checklist: Patient identified, Emergency Drugs available, Suction available and Patient being monitored Patient Re-evaluated:Patient Re-evaluated prior to induction Oxygen Delivery Method: Circle System Utilized Preoxygenation: Pre-oxygenation with 100% oxygen Induction Type: IV induction Ventilation: Mask ventilation without difficulty LMA: LMA inserted LMA Size: 5.0 Number of attempts: 1 Airway Equipment and Method: Bite block Placement Confirmation: positive ETCO2 Tube secured with: Tape Dental Injury: Teeth and Oropharynx as per pre-operative assessment

## 2021-01-30 NOTE — Transfer of Care (Signed)
Immediate Anesthesia Transfer of Care Note  Patient: Matthew Franco  Procedure(s) Performed: OPEN REDUCTION INTERNAL FIXATION (ORIF) RIGHT RADIUS FRACTURE (Right Arm Lower)  Patient Location: PACU  Anesthesia Type:General  Level of Consciousness: drowsy and patient cooperative  Airway & Oxygen Therapy: Patient Spontanous Breathing and Patient connected to nasal cannula oxygen  Post-op Assessment: Report given to RN, Post -op Vital signs reviewed and stable and Patient moving all extremities  Post vital signs: Reviewed and stable  Last Vitals:  Vitals Value Taken Time  BP    Temp    Pulse 83 01/30/21 1120  Resp 12 01/30/21 1120  SpO2 99 % 01/30/21 1120  Vitals shown include unvalidated device data.  Last Pain:  Vitals:   01/30/21 0529  TempSrc:   PainSc: 4       Patients Stated Pain Goal: 0 (01/30/21 0529)  Complications: No complications documented.

## 2021-01-30 NOTE — Op Note (Signed)
01/27/2021 - 01/30/2021  10:57 AM  PATIENT:  Matthew Franco    PRE-OPERATIVE DIAGNOSIS:  Fracture  POST-OPERATIVE DIAGNOSIS:  Same  PROCEDURE:  OPEN REDUCTION INTERNAL FIXATION (ORIF) RIGHT RADIUS FRACTURE  SURGEON:  Sheral Apley, MD  ASSISTANT: Levester Fresh, PA-C, he was present and scrubbed throughout the case, critical for completion in a timely fashion, and for retraction, instrumentation, and closure.   ANESTHESIA:   gen  PREOPERATIVE INDICATIONS:  Matthew Franco is a  50 y.o. male with a diagnosis of Fracture who failed conservative measures and elected for surgical management.    The risks benefits and alternatives were discussed with the patient preoperatively including but not limited to the risks of infection, bleeding, nerve injury, cardiopulmonary complications, the need for revision surgery, among others, and the patient was willing to proceed.  OPERATIVE IMPLANTS: stryker plate  OPERATIVE FINDINGS: unstable fx  BLOOD LOSS: min  COMPLICATIONS: none  TOURNIQUET TIME:  OPERATIVE PROCEDURE:  Patient was identified in the preoperative holding area and site was marked by me He was transported to the operating theater and placed on the table in supine position taking care to pad all bony prominences. After a preincinduction time out anesthesia was induced. The right upper extremity was prepped and draped in normal sterile fashion and a pre-incision timeout was performed. He received ancef for preoperative antibiotics.   Made a volar approach of Sherilyn Cooter to his fracture site of his radius maintaining eye protection of his neurovascular structures.  Identified his fracture site is significantly displaced I cleared of soft tissue and was able to perform a reduction maneuver and placed a clamp to stabilize it.  I placed 2 lag screws across the fracture  I then selected a 7 hole plate and placed it on the volar surface of the radius with 3 screws on either side each of  these had an excellent purchase.  I then took for x-rays of his forearm and was happy with the alignment and hardware placement  I assessed his wrist stability it was now stable at his DRUJ in pronation and supination.  I thoroughly irrigated and closed in layers and placed a short arm splint.  POST OPERATIVE PLAN: mobilize and chemical dvt px. Splint full time

## 2021-01-30 NOTE — Interval H&P Note (Signed)
History and Physical Interval Note:  01/30/2021 6:15 AM  Matthew Franco  has presented today for surgery, with the diagnosis of Fracture.  The various methods of treatment have been discussed with the patient and family. After consideration of risks, benefits and other options for treatment, the patient has consented to  Procedure(s): OPEN REDUCTION INTERNAL FIXATION (ORIF) RIGHT RADIUS FRACTURE (Right) as a surgical intervention.  The patient's history has been reviewed, patient examined, no change in status, stable for surgery.  I have reviewed the patient's chart and labs.  Questions were answered to the patient's satisfaction.     Sheral Apley
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
## 2021-01-30 NOTE — Progress Notes (Signed)
Pateint off of floor in surgery.  Will continue to follow.  His BP seems improved this AM-- no doses of the PRN. Marlin Canary DO

## 2021-01-31 ENCOUNTER — Encounter (HOSPITAL_COMMUNITY): Payer: Self-pay | Admitting: Orthopedic Surgery

## 2021-01-31 LAB — CBC
HCT: 28.4 % — ABNORMAL LOW (ref 39.0–52.0)
Hemoglobin: 9.8 g/dL — ABNORMAL LOW (ref 13.0–17.0)
MCH: 31.2 pg (ref 26.0–34.0)
MCHC: 34.5 g/dL (ref 30.0–36.0)
MCV: 90.4 fL (ref 80.0–100.0)
Platelets: 201 10*3/uL (ref 150–400)
RBC: 3.14 MIL/uL — ABNORMAL LOW (ref 4.22–5.81)
RDW: 13.4 % (ref 11.5–15.5)
WBC: 10.7 10*3/uL — ABNORMAL HIGH (ref 4.0–10.5)
nRBC: 0 % (ref 0.0–0.2)
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
## 2021-01-31 NOTE — Plan of Care (Signed)
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
## 2021-01-31 NOTE — Plan of Care (Signed)
?  Problem: Activity: ?Goal: Risk for activity intolerance will decrease ?Outcome: Progressing ?  ?Problem: Safety: ?Goal: Ability to remain free from injury will improve ?Outcome: Progressing ?  ?Problem: Pain Managment: ?Goal: General experience of comfort will improve ?Outcome: Progressing ?  ?

## 2021-02-01 NOTE — Plan of Care (Signed)
  Problem: Activity: Goal: Risk for activity intolerance will decrease Outcome: Progressing   Problem: Pain Managment: Goal: General experience of comfort will improve Outcome: Progressing   Problem: Safety: Goal: Ability to remain free from injury will improve Outcome: Progressing   
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
## 2021-02-01 NOTE — Progress Notes (Signed)
   Vitals:   01/30/21 1146 01/30/21 1201 01/30/21 1216 01/30/21 1239  BP: (!) 160/89 (!) 147/89 (!) 150/85 (!) 151/83   01/30/21 1258 01/30/21 1552 01/31/21 0325 01/31/21 0749  BP: 127/84 138/80 136/73 (!) 148/82   01/31/21 1531 01/31/21 2017 02/01/21 0310 02/01/21 0830  BP: (!) 141/77 (!) 143/85 (!) 143/84 (!) 164/84   Will continue to monitor BP.  When pain controlled, BP appears to be within normal range.  Marlin Canary DO
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
## 2021-02-02 MED ORDER — OXYCODONE HCL 5 MG PO TABS: 5.0000 mg | ORAL_TABLET | Freq: Four times a day (QID) | ORAL | 0 refills | Status: DC | PRN

## 2021-02-02 MED ORDER — ENOXAPARIN SODIUM 40 MG/0.4ML ~~LOC~~ SOLN: 40.0000 mg | SUBCUTANEOUS | 0 refills | Status: DC

## 2021-02-02 MED ORDER — METHOCARBAMOL 500 MG PO TABS: 500.0000 mg | ORAL_TABLET | Freq: Four times a day (QID) | ORAL | 0 refills | Status: DC | PRN
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
## 2021-02-02 NOTE — Progress Notes (Signed)
Patient's BP remains controlled when pain controlled.  Would recommend low Na diet and PCP follow up for BP checks.  Available as needed. Marlin Canary
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
## 2021-02-03 DIAGNOSIS — T1490XA Injury, unspecified, initial encounter: Secondary | ICD-10-CM

## 2021-02-03 NOTE — Plan of Care (Signed)

## 2021-02-03 NOTE — Plan of Care (Signed)
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
## 2021-02-03 NOTE — Progress Notes (Signed)
Patient D/C around 2250 to home with wife, pt has no questions or concerns, pain med given earlier, IV taken out.

## 2021-02-03 NOTE — TOC Progression Note (Addendum)
Transition of Care [facility]) - Progression Note    Patient Details  Name: Matthew Franco MRN: [id number] Date of Birth: 1970/12/17  Transition of Care [facility] CM/SW Contact  Ralene Bathe, LCSWA Phone Number: 02/03/2021, 11:48 AM  Clinical Narrative:    CSW spoke with the patient's wife, [name].  [name] was inquiring about the patient's d/c and delivery of DME.  [name] also inquired about FMLA.    CSW called [employer] and verified that they have received the referral and will call CSW and inform of date and time of delivery later this afternoon. CSW sent FMLA request to attending.    15:27- CSW spoke with Velna Hatchet from [employer] and was informed that the bed would be delivered today.  Family notified.    Expected Discharge Plan: Home w Home Health Services Barriers to Discharge: Continued Medical Work up  Expected Discharge Plan and Services Expected Discharge Plan: Home w Home Health Services   Discharge Planning Services: CM Consult Post Acute Care Choice: Home Health Living arrangements for the past 2 months: Single Family Home Expected Discharge Date: 02/02/21               DME Arranged: 3-N-1,Hospital bed,Walker platform,Wheelchair manual DME Agency: [facility] Date DME Agency Contacted: 02/01/21 Time DME Agency Contacted: 1617   HH Arranged: PT HH Agency: [facility] Date HH Agency Contacted: 02/01/21 Time HH Agency Contacted: 1617 Representative spoke with at [facility] Agency: Barbara Cower   Social Determinants of Health (SDOH) Interventions    Readmission Risk Interventions No flowsheet data found.

## 2021-02-03 NOTE — Progress Notes (Signed)
Provided discharge education/instructions to Pt and his daughter, all questions and concerns addressed, Pt not in distress. DME delivered to room and confirmed with Pt's wife that bed was delivered to their house. RN demonstrated Lovenox administration to Pt, Pt verbalized understanding. Pt to discharge home with belongings accompanied by wife.

## 2021-02-04 ENCOUNTER — Encounter: Payer: Self-pay | Admitting: Internal Medicine

## 2021-02-14 ENCOUNTER — Ambulatory Visit
Admission: RE | Admit: 2021-02-14 | Discharge: 2021-02-14 | Disposition: A | Discharge status: Home / Self Care | Payer: 59 | Source: Ambulatory Visit | Attending: Orthopedic Surgery | Admitting: Orthopedic Surgery

## 2021-02-14 ENCOUNTER — Other Ambulatory Visit: Payer: Self-pay | Admitting: Orthopedic Surgery

## 2021-02-14 DIAGNOSIS — M25561 Pain in right knee: Secondary | ICD-10-CM

## 2021-11-17 ENCOUNTER — Other Ambulatory Visit: Payer: Self-pay | Admitting: Family Medicine

## 2021-11-17 ENCOUNTER — Other Ambulatory Visit: Payer: Self-pay | Admitting: Orthopedic Surgery

## 2021-11-17 DIAGNOSIS — S7291XA Unspecified fracture of right femur, initial encounter for closed fracture: Secondary | ICD-10-CM

## 2021-12-03 ENCOUNTER — Ambulatory Visit
Admission: RE | Admit: 2021-12-03 | Discharge: 2021-12-03 | Disposition: A | Discharge status: Home / Self Care | Payer: 59 | Source: Ambulatory Visit | Attending: Orthopedic Surgery | Admitting: Orthopedic Surgery

## 2021-12-03 DIAGNOSIS — S7291XA Unspecified fracture of right femur, initial encounter for closed fracture: Secondary | ICD-10-CM
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
## 2024-08-21 ENCOUNTER — Encounter: Payer: Self-pay | Admitting: *Deleted

## 2024-08-21 ENCOUNTER — Ambulatory Visit
Admission: EM | Admit: 2024-08-21 | Discharge: 2024-08-21 | Disposition: A | Discharge status: Home / Self Care | Attending: Student | Admitting: Student

## 2024-08-21 ENCOUNTER — Other Ambulatory Visit: Payer: Self-pay

## 2024-08-21 DIAGNOSIS — J4521 Mild intermittent asthma with (acute) exacerbation: Secondary | ICD-10-CM | POA: Diagnosis not present

## 2024-08-21 MED ORDER — PREDNISONE 20 MG PO TABS: 40.0000 mg | ORAL_TABLET | Freq: Every day | ORAL | 0 refills | Status: AC

## 2024-08-21 MED ORDER — IPRATROPIUM-ALBUTEROL 0.5-2.5 (3) MG/3ML IN SOLN
3.0000 mL | Freq: Once | RESPIRATORY_TRACT | Status: AC
  Administered 2024-08-21: 3 mL via RESPIRATORY_TRACT

## 2024-08-21 MED ORDER — ALBUTEROL SULFATE HFA 108 (90 BASE) MCG/ACT IN AERS: 2.0000 | INHALATION_SPRAY | Freq: Four times a day (QID) | RESPIRATORY_TRACT | 0 refills | Status: AC | PRN

## 2024-08-21 NOTE — ED Triage Notes (Signed)
 Pt reports productive cough for approx 10 days. He has tried mucinex but I didn't like it because it dried it out. Also has been using his inhaler but ran out. Reports chest pain with cough. Also using ibuprofen

## 2024-08-21 NOTE — Discharge Instructions (Signed)
-  Albuterol  inhaler as needed for cough, wheezing, shortness of breath, 1 to 2 puffs every 6 hours as needed. -Prednisone , 2 pills taken at the same time for 5 days in a row.  Try taking this earlier in the day as it can give you energy. Avoid NSAIDs like ibuprofen and alleve while taking this medication as they can increase your risk of stomach upset and even GI bleeding when in combination with a steroid. You can continue tylenol  (acetaminophen ) up to 1000mg  3x daily. - Follow-up if symptoms worsen, including shortness of breath, you start coughing up dark or red mucus, chest tightness, etc.
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
## 2024-10-14 ENCOUNTER — Emergency Department (HOSPITAL_BASED_OUTPATIENT_CLINIC_OR_DEPARTMENT_OTHER)
Admission: EM | Admit: 2024-10-14 | Discharge: 2024-10-14 | Disposition: A | Discharge status: Home / Self Care | Payer: Worker's Compensation | Attending: Emergency Medicine | Admitting: Emergency Medicine

## 2024-10-14 ENCOUNTER — Emergency Department (HOSPITAL_BASED_OUTPATIENT_CLINIC_OR_DEPARTMENT_OTHER): Payer: Worker's Compensation

## 2024-10-14 ENCOUNTER — Encounter (HOSPITAL_BASED_OUTPATIENT_CLINIC_OR_DEPARTMENT_OTHER): Payer: Self-pay | Admitting: Emergency Medicine

## 2024-10-14 ENCOUNTER — Other Ambulatory Visit: Payer: Self-pay

## 2024-10-14 DIAGNOSIS — Y990 Civilian activity done for income or pay: Secondary | ICD-10-CM | POA: Diagnosis not present

## 2024-10-14 DIAGNOSIS — W01198A Fall on same level from slipping, tripping and stumbling with subsequent striking against other object, initial encounter: Secondary | ICD-10-CM | POA: Insufficient documentation

## 2024-10-14 DIAGNOSIS — S52501A Unspecified fracture of the lower end of right radius, initial encounter for closed fracture: Secondary | ICD-10-CM

## 2024-10-14 DIAGNOSIS — S59911A Unspecified injury of right forearm, initial encounter: Secondary | ICD-10-CM | POA: Diagnosis present

## 2024-10-14 DIAGNOSIS — S5291XA Unspecified fracture of right forearm, initial encounter for closed fracture: Secondary | ICD-10-CM | POA: Insufficient documentation

## 2024-10-14 NOTE — ED Triage Notes (Signed)
 Pt reports pipe fell on RUE this morning; c/o pain to forearm and wrist

## 2024-10-14 NOTE — Discharge Instructions (Signed)
 Call on Monday to make an appointment to follow-up with a hand surgeon.  Return to the emergency room if you have any worsening symptoms.

## 2024-10-14 NOTE — ED Provider Notes (Signed)
 " [facility] Provider Note   CSN: [id number] Arrival date & time: 10/14/24  1153     Patient presents with: Arm Injury   Matthew Franco is a 53 y.o. male.   Patient is a 53 year old male who presents with pain in his right arm.  He was moving some PVC pipe at work and one of the heavy pieces of pipe fell down and hit his right forearm.  He has had pain and swelling to the area since that time.       Prior to Admission medications  Medication Sig Start Date End Date Taking? Authorizing Provider  albuterol  (VENTOLIN  HFA) 108 (90 Base) MCG/ACT inhaler Inhale 2 puffs into the lungs every 6 (six) hours as needed for wheezing or shortness of breath. 08/21/24   Graham, Laura E, PA-C  Multiple Vitamins-Minerals (ONE-A-DAY MENS 50+) TABS Take 1 tablet by mouth daily.    [provider]  tadalafil  (CIALIS ) 20 MG tablet Take 1 tablet (20 mg total) by mouth daily as needed for erectile dysfunction. 12/25/20   Joshua Debby LITTIE, MD  tadalafil  (CIALIS ) 20 MG tablet Take 20 mg by mouth daily as needed for erectile dysfunction. 12/25/20   [provider]    Allergies: Patient has no known allergies.    Review of Systems  Constitutional:  Negative for fever.  Gastrointestinal:  Negative for nausea and vomiting.  Musculoskeletal:  Positive for arthralgias and joint swelling. Negative for back pain and neck pain.  Skin:  Negative for wound.  Neurological:  Negative for weakness, numbness and headaches.    Updated Vital Signs BP (!) 141/85 (BP Location: Left Arm)   Pulse 90   Temp 98.9 F (37.2 C) (Oral)   Resp 17   Ht 5' 10 (1.778 m)   Wt 99.3 kg   SpO2 97%   BMI 31.42 kg/m   Physical Exam Constitutional:      Appearance: He is well-developed.  HENT:     Head: Normocephalic and atraumatic.  Neck:     Comments: No pain to neck or back Musculoskeletal:        General: Tenderness present.     Cervical back: Normal range  of motion and neck supple.     Comments: Patient has tenderness to the distal radius area and his right forearm.  There are some mild swelling to the area.  No wounds.  He has normal sensation and motor function distally.  Radial pulses intact.  No pain to the elbow.  Neurological:     Mental Status: He is alert and oriented to person, place, and time.     (all labs ordered are listed, but only abnormal results are displayed) Labs Reviewed - No data to display  EKG: None  Radiology: DG Forearm Right Result Date: 10/14/2024 CLINICAL DATA:  Right upper extremity pain. EXAM: RIGHT FOREARM - 2 VIEW COMPARISON:  None Available. FINDINGS: There is a linear lucency through the distal radial diaphysis on the lateral view which appears to extend into the soft tissues. However, on the AP view the linear lucency persist through the distal radius and concerning for a nondisplaced distal radial fracture. Clinical correlation recommended. No other acute fracture. Prior ORIF of the midportion of the radial diaphysis with fixation sideplate and screws. The soft tissues are unremarkable. IMPRESSION: Findings concerning for a nondisplaced distal radial fracture. Electronically Signed   By: Vanetta Chou M.D.   On: 10/14/2024 12:36  Procedures   Medications Ordered in the ED - No data to display                                  Medical Decision Making Amount and/or Complexity of Data Reviewed Radiology: ordered.   Patient presents with pain in his right arm after a pipe fell on it.  X-rays which were interpreted by me and confirmed by the radiologist show concerns for nondisplaced distal radius fracture.  He was placed in a volar splint.  Was advised on symptomatic care.  Was given a referral to follow-up with hand surgery.  Was advised to call Monday to make an appointment.  Return precautions were given.     Final diagnoses:  None    ED Discharge Orders     None          Lenor Hollering, MD 10/14/24 1344  "
